# Patient Record
Sex: Male | Born: 1940 | Hispanic: No | Smoking: Former smoker
Health system: Southern US, Community
[De-identification: ages and names within clinical notes are randomized; demographics above are authoritative.]

## PROBLEM LIST (undated history)

## (undated) DIAGNOSIS — I2510 Atherosclerotic heart disease of native coronary artery without angina pectoris: Secondary | ICD-10-CM

## (undated) DIAGNOSIS — E785 Hyperlipidemia, unspecified: Secondary | ICD-10-CM

## (undated) DIAGNOSIS — I509 Heart failure, unspecified: Secondary | ICD-10-CM

## (undated) DIAGNOSIS — E119 Type 2 diabetes mellitus without complications: Secondary | ICD-10-CM

## (undated) DIAGNOSIS — I5022 Chronic systolic (congestive) heart failure: Secondary | ICD-10-CM

## (undated) HISTORY — PX: CARDIAC SURGERY: SHX584

## (undated) HISTORY — PX: HERNIA REPAIR: SHX51

---

## 2015-02-10 ENCOUNTER — Encounter: Payer: Self-pay | Admitting: Emergency Medicine

## 2015-02-10 ENCOUNTER — Emergency Department: Payer: Medicare Other

## 2015-02-10 ENCOUNTER — Inpatient Hospital Stay
Admission: EM | Admit: 2015-02-10 | Discharge: 2015-02-19 | DRG: 246 | Disposition: A | Discharge status: Skilled Nursing Facility | Payer: Medicare Other | Attending: Internal Medicine | Admitting: Internal Medicine

## 2015-02-10 DIAGNOSIS — Z87891 Personal history of nicotine dependence: Secondary | ICD-10-CM | POA: Diagnosis not present

## 2015-02-10 DIAGNOSIS — F0280 Dementia in other diseases classified elsewhere without behavioral disturbance: Secondary | ICD-10-CM | POA: Diagnosis present

## 2015-02-10 DIAGNOSIS — G309 Alzheimer's disease, unspecified: Secondary | ICD-10-CM | POA: Diagnosis present

## 2015-02-10 DIAGNOSIS — E876 Hypokalemia: Secondary | ICD-10-CM | POA: Diagnosis present

## 2015-02-10 DIAGNOSIS — R627 Adult failure to thrive: Secondary | ICD-10-CM | POA: Diagnosis present

## 2015-02-10 DIAGNOSIS — J90 Pleural effusion, not elsewhere classified: Secondary | ICD-10-CM | POA: Diagnosis present

## 2015-02-10 DIAGNOSIS — R64 Cachexia: Secondary | ICD-10-CM | POA: Diagnosis present

## 2015-02-10 DIAGNOSIS — Z9889 Other specified postprocedural states: Secondary | ICD-10-CM

## 2015-02-10 DIAGNOSIS — I25110 Atherosclerotic heart disease of native coronary artery with unstable angina pectoris: Secondary | ICD-10-CM | POA: Diagnosis present

## 2015-02-10 DIAGNOSIS — R570 Cardiogenic shock: Secondary | ICD-10-CM | POA: Diagnosis present

## 2015-02-10 DIAGNOSIS — I5021 Acute systolic (congestive) heart failure: Secondary | ICD-10-CM

## 2015-02-10 DIAGNOSIS — Z8249 Family history of ischemic heart disease and other diseases of the circulatory system: Secondary | ICD-10-CM

## 2015-02-10 DIAGNOSIS — I110 Hypertensive heart disease with heart failure: Secondary | ICD-10-CM | POA: Diagnosis present

## 2015-02-10 DIAGNOSIS — I509 Heart failure, unspecified: Secondary | ICD-10-CM

## 2015-02-10 DIAGNOSIS — E119 Type 2 diabetes mellitus without complications: Secondary | ICD-10-CM | POA: Diagnosis present

## 2015-02-10 DIAGNOSIS — I429 Cardiomyopathy, unspecified: Secondary | ICD-10-CM | POA: Diagnosis present

## 2015-02-10 DIAGNOSIS — E43 Unspecified severe protein-calorie malnutrition: Secondary | ICD-10-CM | POA: Diagnosis present

## 2015-02-10 DIAGNOSIS — F32A Depression, unspecified: Secondary | ICD-10-CM | POA: Insufficient documentation

## 2015-02-10 DIAGNOSIS — Z7982 Long term (current) use of aspirin: Secondary | ICD-10-CM

## 2015-02-10 DIAGNOSIS — Z681 Body mass index (BMI) 19 or less, adult: Secondary | ICD-10-CM | POA: Diagnosis not present

## 2015-02-10 DIAGNOSIS — R0600 Dyspnea, unspecified: Secondary | ICD-10-CM | POA: Diagnosis not present

## 2015-02-10 DIAGNOSIS — K760 Fatty (change of) liver, not elsewhere classified: Secondary | ICD-10-CM | POA: Diagnosis present

## 2015-02-10 DIAGNOSIS — I5023 Acute on chronic systolic (congestive) heart failure: Secondary | ICD-10-CM | POA: Diagnosis present

## 2015-02-10 DIAGNOSIS — R634 Abnormal weight loss: Secondary | ICD-10-CM | POA: Diagnosis not present

## 2015-02-10 DIAGNOSIS — R948 Abnormal results of function studies of other organs and systems: Secondary | ICD-10-CM | POA: Diagnosis present

## 2015-02-10 DIAGNOSIS — G8929 Other chronic pain: Secondary | ICD-10-CM | POA: Diagnosis present

## 2015-02-10 DIAGNOSIS — I25700 Atherosclerosis of coronary artery bypass graft(s), unspecified, with unstable angina pectoris: Secondary | ICD-10-CM | POA: Diagnosis not present

## 2015-02-10 DIAGNOSIS — I700 Atherosclerosis of aorta: Secondary | ICD-10-CM | POA: Diagnosis present

## 2015-02-10 DIAGNOSIS — Z79899 Other long term (current) drug therapy: Secondary | ICD-10-CM | POA: Diagnosis not present

## 2015-02-10 DIAGNOSIS — E440 Moderate protein-calorie malnutrition: Secondary | ICD-10-CM | POA: Diagnosis present

## 2015-02-10 DIAGNOSIS — Z66 Do not resuscitate: Secondary | ICD-10-CM | POA: Diagnosis present

## 2015-02-10 DIAGNOSIS — Y832 Surgical operation with anastomosis, bypass or graft as the cause of abnormal reaction of the patient, or of later complication, without mention of misadventure at the time of the procedure: Secondary | ICD-10-CM | POA: Diagnosis present

## 2015-02-10 DIAGNOSIS — E785 Hyperlipidemia, unspecified: Secondary | ICD-10-CM | POA: Diagnosis present

## 2015-02-10 DIAGNOSIS — I214 Non-ST elevation (NSTEMI) myocardial infarction: Secondary | ICD-10-CM | POA: Diagnosis present

## 2015-02-10 DIAGNOSIS — I10 Essential (primary) hypertension: Secondary | ICD-10-CM | POA: Diagnosis not present

## 2015-02-10 DIAGNOSIS — G9341 Metabolic encephalopathy: Secondary | ICD-10-CM | POA: Diagnosis present

## 2015-02-10 DIAGNOSIS — F329 Major depressive disorder, single episode, unspecified: Secondary | ICD-10-CM | POA: Diagnosis present

## 2015-02-10 DIAGNOSIS — R0602 Shortness of breath: Secondary | ICD-10-CM | POA: Diagnosis not present

## 2015-02-10 DIAGNOSIS — Z880 Allergy status to penicillin: Secondary | ICD-10-CM | POA: Diagnosis not present

## 2015-02-10 DIAGNOSIS — I200 Unstable angina: Secondary | ICD-10-CM | POA: Insufficient documentation

## 2015-02-10 DIAGNOSIS — R933 Abnormal findings on diagnostic imaging of other parts of digestive tract: Secondary | ICD-10-CM | POA: Diagnosis not present

## 2015-02-10 DIAGNOSIS — T82898A Other specified complication of vascular prosthetic devices, implants and grafts, initial encounter: Secondary | ICD-10-CM | POA: Diagnosis present

## 2015-02-10 DIAGNOSIS — G300 Alzheimer's disease with early onset: Secondary | ICD-10-CM | POA: Diagnosis not present

## 2015-02-10 HISTORY — DX: Heart failure, unspecified: I50.9

## 2015-02-10 HISTORY — DX: Type 2 diabetes mellitus without complications: E11.9

## 2015-02-10 HISTORY — DX: Hyperlipidemia, unspecified: E78.5

## 2015-02-10 HISTORY — DX: Atherosclerotic heart disease of native coronary artery without angina pectoris: I25.10

## 2015-02-10 HISTORY — DX: Chronic systolic (congestive) heart failure: I50.22

## 2015-02-10 LAB — GLUCOSE, CAPILLARY: GLUCOSE-CAPILLARY: 117 mg/dL — AB (ref 65–99)

## 2015-02-10 LAB — TYPE AND SCREEN
ABO/RH(D): O POS
Antibody Screen: NEGATIVE

## 2015-02-10 LAB — CBC WITH DIFFERENTIAL/PLATELET
Basophils Absolute: 0.1 10*3/uL (ref 0–0.1)
Basophils Relative: 1 %
EOS PCT: 1 %
Eosinophils Absolute: 0 10*3/uL (ref 0–0.7)
HEMATOCRIT: 40.9 % (ref 40.0–52.0)
Hemoglobin: 12.8 g/dL — ABNORMAL LOW (ref 13.0–18.0)
LYMPHS ABS: 1.6 10*3/uL (ref 1.0–3.6)
LYMPHS PCT: 24 %
MCH: 25.1 pg — AB (ref 26.0–34.0)
MCHC: 31.4 g/dL — AB (ref 32.0–36.0)
MCV: 79.8 fL — AB (ref 80.0–100.0)
MONO ABS: 0.3 10*3/uL (ref 0.2–1.0)
MONOS PCT: 5 %
NEUTROS ABS: 4.6 10*3/uL (ref 1.4–6.5)
Neutrophils Relative %: 69 %
PLATELETS: 372 10*3/uL (ref 150–440)
RBC: 5.12 MIL/uL (ref 4.40–5.90)
RDW: 17.9 % — AB (ref 11.5–14.5)
WBC: 6.7 10*3/uL (ref 3.8–10.6)

## 2015-02-10 LAB — HEPATIC FUNCTION PANEL
ALT: 14 U/L — ABNORMAL LOW (ref 17–63)
AST: 19 U/L (ref 15–41)
Albumin: 3.4 g/dL — ABNORMAL LOW (ref 3.5–5.0)
Alkaline Phosphatase: 69 U/L (ref 38–126)
BILIRUBIN DIRECT: 0.3 mg/dL (ref 0.1–0.5)
Indirect Bilirubin: 1 mg/dL — ABNORMAL HIGH (ref 0.3–0.9)
TOTAL PROTEIN: 7.6 g/dL (ref 6.5–8.1)
Total Bilirubin: 1.3 mg/dL — ABNORMAL HIGH (ref 0.3–1.2)

## 2015-02-10 LAB — TROPONIN I
TROPONIN I: 0.03 ng/mL (ref <0.031)
Troponin I: <0.03 ng/mL (ref <0.031)
Troponin I: 0.03 ng/mL (ref <0.031)

## 2015-02-10 LAB — BASIC METABOLIC PANEL
ANION GAP: 10 (ref 5–15)
BUN: 18 mg/dL (ref 6–20)
CALCIUM: 9 mg/dL (ref 8.9–10.3)
CO2: 24 mmol/L (ref 22–32)
CREATININE: 1.02 mg/dL (ref 0.61–1.24)
Chloride: 106 mmol/L (ref 101–111)
GFR calc Af Amer: >60 mL/min (ref >60)
GFR calc non Af Amer: >60 mL/min (ref >60)
GLUCOSE: 133 mg/dL — AB (ref 65–99)
Potassium: 3.8 mmol/L (ref 3.5–5.1)
Sodium: 140 mmol/L (ref 135–145)

## 2015-02-10 LAB — CBC
HEMATOCRIT: 38.1 % — AB (ref 40.0–52.0)
HEMOGLOBIN: 12.3 g/dL — AB (ref 13.0–18.0)
MCH: 26 pg (ref 26.0–34.0)
MCHC: 32.3 g/dL (ref 32.0–36.0)
MCV: 80.7 fL (ref 80.0–100.0)
Platelets: 365 10*3/uL (ref 150–440)
RBC: 4.73 MIL/uL (ref 4.40–5.90)
RDW: 17.9 % — ABNORMAL HIGH (ref 11.5–14.5)
WBC: 7.9 10*3/uL (ref 3.8–10.6)

## 2015-02-10 LAB — ABO/RH: ABO/RH(D): O POS

## 2015-02-10 LAB — BRAIN NATRIURETIC PEPTIDE: B Natriuretic Peptide: 1593 pg/mL — ABNORMAL HIGH (ref 0.0–100.0)

## 2015-02-10 LAB — CREATININE, SERUM
Creatinine, Ser: 1.1 mg/dL (ref 0.61–1.24)
GFR calc Af Amer: >60 mL/min (ref >60)

## 2015-02-10 MED ORDER — HYDROCODONE-ACETAMINOPHEN 5-325 MG PO TABS
1.0000 | ORAL_TABLET | ORAL | Status: DC | PRN
  Administered 2015-02-13: 2 via ORAL
  Filled 2015-02-10: qty 2

## 2015-02-10 MED ORDER — FUROSEMIDE 10 MG/ML IJ SOLN
40.0000 mg | Freq: Two times a day (BID) | INTRAMUSCULAR | Status: DC
  Administered 2015-02-10: 40 mg via INTRAVENOUS
  Filled 2015-02-10 (×2): qty 4

## 2015-02-10 MED ORDER — PRAVASTATIN SODIUM 20 MG PO TABS
80.0000 mg | ORAL_TABLET | Freq: Every day | ORAL | Status: DC
  Administered 2015-02-10 – 2015-02-15 (×6): 80 mg via ORAL
  Filled 2015-02-10 (×6): qty 4

## 2015-02-10 MED ORDER — GLIPIZIDE 5 MG PO TABS
10.0000 mg | ORAL_TABLET | Freq: Every day | ORAL | Status: DC
  Administered 2015-02-11 – 2015-02-15 (×5): 10 mg via ORAL
  Filled 2015-02-10 (×5): qty 2

## 2015-02-10 MED ORDER — PNEUMOCOCCAL VAC POLYVALENT 25 MCG/0.5ML IJ INJ
0.5000 mL | INJECTION | INTRAMUSCULAR | Status: DC
  Filled 2015-02-10: qty 0.5

## 2015-02-10 MED ORDER — IOHEXOL 350 MG/ML SOLN
100.0000 mL | Freq: Once | INTRAVENOUS | Status: AC | PRN
  Administered 2015-02-10: 100 mL via INTRAVENOUS

## 2015-02-10 MED ORDER — FUROSEMIDE 10 MG/ML IJ SOLN
40.0000 mg | Freq: Once | INTRAMUSCULAR | Status: AC
  Administered 2015-02-10: 40 mg via INTRAVENOUS
  Filled 2015-02-10: qty 4

## 2015-02-10 MED ORDER — ONDANSETRON HCL 4 MG/2ML IJ SOLN: 4.0000 mg | Freq: Four times a day (QID) | INTRAMUSCULAR | Status: DC | PRN

## 2015-02-10 MED ORDER — INFLUENZA VAC SPLIT QUAD 0.5 ML IM SUSY
0.5000 mL | PREFILLED_SYRINGE | INTRAMUSCULAR | Status: DC
Start: 2015-02-11 — End: 2015-02-16
  Filled 2015-02-10: qty 0.5

## 2015-02-10 MED ORDER — ACETAMINOPHEN 325 MG PO TABS: 650.0000 mg | ORAL_TABLET | Freq: Four times a day (QID) | ORAL | Status: DC | PRN

## 2015-02-10 MED ORDER — ENOXAPARIN SODIUM 40 MG/0.4ML ~~LOC~~ SOLN
40.0000 mg | SUBCUTANEOUS | Status: DC
  Administered 2015-02-10 – 2015-02-12 (×3): 40 mg via SUBCUTANEOUS
  Filled 2015-02-10 (×3): qty 0.4

## 2015-02-10 MED ORDER — CHOLECALCIFEROL 250 MCG (10000 UT) PO TABS: 1.0000 | ORAL_TABLET | Freq: Every day | ORAL | Status: DC

## 2015-02-10 MED ORDER — VITAMIN D 1000 UNITS PO TABS
1000.0000 [IU] | ORAL_TABLET | Freq: Every day | ORAL | Status: DC
  Administered 2015-02-10 – 2015-02-15 (×6): 1000 [IU] via ORAL
  Filled 2015-02-10 (×8): qty 1

## 2015-02-10 MED ORDER — DONEPEZIL HCL 5 MG PO TABS
10.0000 mg | ORAL_TABLET | Freq: Every day | ORAL | Status: DC
  Administered 2015-02-10 – 2015-02-13 (×4): 10 mg via ORAL
  Filled 2015-02-10 (×4): qty 2

## 2015-02-10 MED ORDER — IOHEXOL 240 MG/ML SOLN
25.0000 mL | Freq: Once | INTRAMUSCULAR | Status: AC | PRN
  Administered 2015-02-10: 25 mL via ORAL

## 2015-02-10 MED ORDER — ACETAMINOPHEN 650 MG RE SUPP: 650.0000 mg | Freq: Four times a day (QID) | RECTAL | Status: DC | PRN

## 2015-02-10 MED ORDER — ASPIRIN EC 81 MG PO TBEC
81.0000 mg | DELAYED_RELEASE_TABLET | Freq: Every day | ORAL | Status: DC
  Administered 2015-02-10 – 2015-02-15 (×5): 81 mg via ORAL
  Filled 2015-02-10 (×6): qty 1

## 2015-02-10 MED ORDER — METFORMIN HCL 500 MG PO TABS
1000.0000 mg | ORAL_TABLET | Freq: Two times a day (BID) | ORAL | Status: DC
  Administered 2015-02-12: 1000 mg via ORAL
  Filled 2015-02-10 (×2): qty 2

## 2015-02-10 MED ORDER — ONDANSETRON HCL 4 MG PO TABS: 4.0000 mg | ORAL_TABLET | Freq: Four times a day (QID) | ORAL | Status: DC | PRN

## 2015-02-10 NOTE — H&P (Signed)
[facility] - [place] [facility]   PATIENT NAME: Manuel Anthony    MR#:  [id number]  DATE OF BIRTH:  [birth year]  DATE OF ADMISSION:  02/10/2015  PRIMARY CARE PHYSICIAN: No PCP Per Patient   REQUESTING/REFERRING PHYSICIAN:   CHIEF COMPLAINT: Shortness of breath    Chief Complaint  Patient presents with  . Shortness of Breath  . Weakness    HISTORY OF PRESENT ILLNESS:  Manuel Anthony  is a 75 y.o. male with a known history of htn,DMII,came in with SOB. Patient is having shortness of breath for the past 1 week associated with orthopnea, PND. No pedal edema. Had chest discomfort earlier today. Denies any at this time. Has some cough but no phlegm. Family also reported that he is been losing weight and also has some fatigue. No night sweats. No recent travel.  PAST MEDICAL HISTORY:   Past Medical History  Diagnosis Date  . Diabetes mellitus without complication (HCC)   . CHF (congestive heart failure) (HCC)     PAST SURGICAL HISTOIRY:  H/o quadriple  Bypass  Past Surgical History  Procedure Laterality Date  . Cardiac surgery    . Hernia repair      SOCIAL HISTORY:   Social History  Substance Use Topics  . Smoking status: Former Games developer  . Smokeless tobacco: Not on file  . Alcohol Use: No    FAMILY HISTORY:  No family history on file. No family h/o htn,or DMII DRUG ALLERGIES:   Allergies  Allergen Reactions  . Penicillins Other (See Comments)    "passes out"    REVIEW OF SYSTEMS:  CONSTITUTIONAL: No fever, fatigue or weakness.  EYES: No blurred or double vision.  EARS, NOSE, AND THROAT: No tinnitus or ear pain.  RESPIRATORY: shortness of breath, fatigue, orthopnea, PND for 1 week.  CARDIOVASCULAR: , orthopnea, edema.  GASTROINTESTINAL: No nausea, vomiting, diarrhea or abdominal pain.  GENITOURINARY: No dysuria, hematuria.  ENDOCRINE: No polyuria, nocturia,  HEMATOLOGY: No anemia, easy bruising or bleeding SKIN: No rash or  lesion. MUSCULOSKELETAL: No joint pain or arthritis.   NEUROLOGIC: No tingling, numbness, weakness.  PSYCHIATRY: No anxiety or depression.   MEDICATIONS AT HOME:   Prior to Admission medications   Medication Sig Start Date End Date Taking? Authorizing Provider  aspirin EC 81 MG tablet Take 81 mg by mouth daily.   Yes Historical Provider, MD  Cholecalciferol 10000 units TABS Take 1 tablet by mouth daily.   Yes Historical Provider, MD  donepezil (ARICEPT) 10 MG tablet Take 10 mg by mouth at bedtime.   Yes Historical Provider, MD  glipiZIDE (GLUCOTROL) 10 MG tablet Take 10 mg by mouth daily before breakfast.   Yes Historical Provider, MD  metFORMIN (GLUCOPHAGE) 1000 MG tablet Take 1,000 mg by mouth 2 (two) times daily with a meal.   Yes Historical Provider, MD  pravastatin (PRAVACHOL) 80 MG tablet Take 80 mg by mouth daily.   Yes Historical Provider, MD      VITAL SIGNS:  Blood pressure 117/85, pulse 116, temperature 97.7 F (36.5 C), temperature source Oral, resp. rate 18, height  (1.676 m), weight 58.968 kg (130 lb), SpO2 100 %.  PHYSICAL EXAMINATION:  GENERAL:  75 y.o.-year-old patient lying in the bed with no acute distress.  EYES: Pupils equal, round, reactive to light and accommodation. No scleral icterus. Extraocular muscles intact.  HEENT: Head atraumatic, normocephalic. Oropharynx and nasopharynx clear.  NECK:  Supple, no jugular venous distention. No thyroid enlargement, no  tenderness.  LUNGS: decreased breath sounds at bases, faint basilar crepitations present.  CARDIOVASCULAR: S1, S2 normal. No murmurs, rubs, or gallops.  ABDOMEN: Soft, nontender, nondistended. Bowel sounds present. No organomegaly or mass.  EXTREMITIES: No pedal edema, cyanosis, or clubbing.  NEUROLOGIC: Cranial nerves II through XII are intact. Muscle strength 5/5 in all extremities. Sensation intact. Gait not checked.  PSYCHIATRIC: The patient is alert and oriented x 3.  SKIN: No obvious rash,  lesion, or ulcer.   LABORATORY PANEL:   CBC  Recent Labs Lab 02/10/15 0838  WBC 6.7  HGB 12.8*  HCT 40.9  PLT 372   ------------------------------------------------------------------------------------------------------------------  Chemistries   Recent Labs Lab 02/10/15 0838  NA 140  K 3.8  CL 106  CO2 24  GLUCOSE 133*  BUN 18  CREATININE 1.02  CALCIUM 9.0  AST 19  ALT 14*  ALKPHOS 69  BILITOT 1.3*   ------------------------------------------------------------------------------------------------------------------  Cardiac Enzymes  Recent Labs Lab 02/10/15 0838  TROPONINI <0.03   ------------------------------------------------------------------------------------------------------------------  RADIOLOGY:  Ct Angio Chest Pe W/cm &/or Wo Cm  02/10/2015  CLINICAL DATA:  Worsening shortness of breath over the past few days. Weakness. EXAM: CT ANGIOGRAPHY CHEST CT ABDOMEN AND PELVIS WITH CONTRAST TECHNIQUE: Multidetector CT imaging of the chest was performed using the standard protocol during bolus administration of intravenous contrast. Multiplanar CT image reconstructions and MIPs were obtained to evaluate the vascular anatomy. Multidetector CT imaging of the abdomen and pelvis was performed using the standard protocol during bolus administration of intravenous contrast. CONTRAST:  OMNIPAQUE IOHEXOL 350 MG/ML SOLN COMPARISON:  Chest x-ray earlier today. FINDINGS: CTA CHEST FINDINGS No filling defects in the pulmonary arteries to suggest pulmonary emboli. There is mild cardiomegaly. Coronary artery calcifications. Prior CABG. Aorta is normal caliber. No mediastinal, hilar, or axillary adenopathy. Moderate bilateral pleural effusions, right greater than left. There is a subpulmonic component on the left. Minimal compressive atelectasis in the lower lobes bilaterally. Peripheral densities are noted in the upper lobes bilaterally most compatible with scarring. Several  scattered pulmonary nodules in the upper lobes bilaterally as well. 8 mm nodule in the left upper lobe on image 25. 9 mm nodule in the upper lobe on image 20. 8 mm nodule in the right upper lobe on image 27 with smaller subpleural nodule in on image 23. Mild peribronchial thickening. Chest wall soft tissues are unremarkable. No acute bony abnormality. CT ABDOMEN and PELVIS FINDINGS Low-density area adjacent to the falciform ligament within the liver likely represents area of focal fatty infiltration. No suspicious hepatic abnormality. Spleen, pancreas, adrenals and right kidney are unremarkable. Left lower pole parapelvic cyst which appears benign. No hydronephrosis. Aortic atherosclerotic calcifications and irregularity. No aneurysm. Stomach, small bowel decompressed and unremarkable. Circumferential wall thickening within the rectum suggesting proctitis. Remainder of the colon wall is normal. There is a small left femoral hernia noted containing the anterior wall of the sigmoid colon. No obstruction. Urinary bladder is unremarkable. There is trace free fluid in the pelvis. No free air or adenopathy. No acute bony abnormality or focal bone lesion. Review of the MIP images confirms the above findings. IMPRESSION: No evidence of pulmonary embolus. Cardiomegaly.  Moderate bilateral pleural effusions. Peripheral densities in the upper lobes bilaterally most compatible with scarring. Bilateral upper lobe nodular densities warrant follow-up. Recommend follow-up CT in 3-6 months to assess stability. Area of circumferential wall thickening within the click rectum most compatible with infectious or inflammatory proctitis. Cancer felt less likely but cannot be completely excluded. Consider  colonoscopy after acute symptoms resolve. Small left femoral hernia containing the anterior wall of the sigmoid colon. No evidence of bowel obstruction. Small amount of free fluid in the pelvis. Electronically Signed   By: Charlett Nose  M.D.   On: 02/10/2015 11:35   Ct Abdomen Pelvis W Contrast  02/10/2015  CLINICAL DATA:  Worsening shortness of breath over the past few days. Weakness. EXAM: CT ANGIOGRAPHY CHEST CT ABDOMEN AND PELVIS WITH CONTRAST TECHNIQUE: Multidetector CT imaging of the chest was performed using the standard protocol during bolus administration of intravenous contrast. Multiplanar CT image reconstructions and MIPs were obtained to evaluate the vascular anatomy. Multidetector CT imaging of the abdomen and pelvis was performed using the standard protocol during bolus administration of intravenous contrast. CONTRAST:  OMNIPAQUE IOHEXOL 350 MG/ML SOLN COMPARISON:  Chest x-ray earlier today. FINDINGS: CTA CHEST FINDINGS No filling defects in the pulmonary arteries to suggest pulmonary emboli. There is mild cardiomegaly. Coronary artery calcifications. Prior CABG. Aorta is normal caliber. No mediastinal, hilar, or axillary adenopathy. Moderate bilateral pleural effusions, right greater than left. There is a subpulmonic component on the left. Minimal compressive atelectasis in the lower lobes bilaterally. Peripheral densities are noted in the upper lobes bilaterally most compatible with scarring. Several scattered pulmonary nodules in the upper lobes bilaterally as well. 8 mm nodule in the left upper lobe on image 25. 9 mm nodule in the upper lobe on image 20. 8 mm nodule in the right upper lobe on image 27 with smaller subpleural nodule in on image 23. Mild peribronchial thickening. Chest wall soft tissues are unremarkable. No acute bony abnormality. CT ABDOMEN and PELVIS FINDINGS Low-density area adjacent to the falciform ligament within the liver likely represents area of focal fatty infiltration. No suspicious hepatic abnormality. Spleen, pancreas, adrenals and right kidney are unremarkable. Left lower pole parapelvic cyst which appears benign. No hydronephrosis. Aortic atherosclerotic calcifications and irregularity. No  aneurysm. Stomach, small bowel decompressed and unremarkable. Circumferential wall thickening within the rectum suggesting proctitis. Remainder of the colon wall is normal. There is a small left femoral hernia noted containing the anterior wall of the sigmoid colon. No obstruction. Urinary bladder is unremarkable. There is trace free fluid in the pelvis. No free air or adenopathy. No acute bony abnormality or focal bone lesion. Review of the MIP images confirms the above findings. IMPRESSION: No evidence of pulmonary embolus. Cardiomegaly.  Moderate bilateral pleural effusions. Peripheral densities in the upper lobes bilaterally most compatible with scarring. Bilateral upper lobe nodular densities warrant follow-up. Recommend follow-up CT in 3-6 months to assess stability. Area of circumferential wall thickening within the click rectum most compatible with infectious or inflammatory proctitis. Cancer felt less likely but cannot be completely excluded. Consider colonoscopy after acute symptoms resolve. Small left femoral hernia containing the anterior wall of the sigmoid colon. No evidence of bowel obstruction. Small amount of free fluid in the pelvis. Electronically Signed   By: Charlett Nose M.D.   On: 02/10/2015 11:35   Dg Chest Port 1 View  02/10/2015  CLINICAL DATA:  Shortness of Breath EXAM: PORTABLE CHEST 1 VIEW COMPARISON:  None. FINDINGS: There is mild bibasilar interstitial and patchy alveolar edema. Lungs elsewhere clear. Heart is enlarged with pulmonary venous hypertension. No adenopathy. Patient is status post coronary artery bypass grafting. There is degenerative change in each shoulder. IMPRESSION: Evidence of a degree of congestive heart failure. Electronically Signed   By: Bretta Bang III M.D.   On: 02/10/2015 08:29  EKG:   Orders placed or performed during the hospital encounter of 02/10/15  . ED EKG  . ED EKG  . EKG 12-Lead  . EKG 12-Lead   sinus tachycardia at 1 13 bpm, T-wave  inversions in leads V4, V5, V6.   Impression and plan #1 acute CHF initial onset unknown systolic or diastolic: Check echocardiogram, start IV Lasix, monitor on telemetry, check daily weights, fluid restriction up to 1-1/2 L a day. #2 fatigue, shortness of breath and weight loss concerning for malignancy, CT chest negative for PE but possible nodules in the lungs. Repeat chest CAT scan in 3 months CT abdomen showed proctitis patient has no abdominal pain or diarrhea. So we will consult GI for possible colonoscopy because of the weight loss, fatigue. #3 hypertension: Controlled #4 diabetes mellitus type 2: Continue home medications. History of coronary artery disease now with congestive heart failure: Check cardiac markers, echocardiogram.  Patient is from [facility] but they don't have beds on tele,so he is admitted here,    All the records are reviewed and case discussed with ED provider. Management plans discussed with the patient, family and they are in agreement.  CODE STATUS: full  TOTAL TIME TAKING CARE OF THIS PATIENT: .    Katha Hamming M.D on 02/10/2015 at 1:22 PM  Between 7am to 6pm - Pager - [phone]  After 6pm go to [email or web address] - password [email or web address] [facility]  [place] [facility]  Office  [phone]  CC: Primary care physician; No PCP Per Patient  Note: This dictation was prepared with Dragon dictation along with smaller phrase technology. Any transcriptional errors that result from this process are unintentional.

## 2015-02-10 NOTE — ED Provider Notes (Signed)
[facility] Emergency Department Provider Note     Time seen: ----------------------------------------- 8:02 AM on 02/10/2015 -----------------------------------------    I have reviewed the triage vital signs and the nursing notes.   HISTORY  Chief Complaint No chief complaint on file.    HPI Manuel Anthony is a 75 y.o. male who presents ER for worsening shortness of breath over the last several days. Patient is also had significant weakness, has appeared increasingly lethargic according to her family members that have recently moved here. Patient states his mouth is very dry, son states she has early dementia. Patient has history of diabetes and history of heart surgery, denies fevers but is positive for chest pain, denies vomiting or diarrhea.   No past medical history on file.  There are no active problems to display for this patient.   No past surgical history on file.  Allergies Review of patient's allergies indicates not on file.  Social History Social History  Substance Use Topics  . Smoking status: Not on file  . Smokeless tobacco: Not on file  . Alcohol Use: Not on file    Review of Systems Constitutional: Negative for fever. Eyes: Negative for visual changes. ENT: Negative for sore throat. Cardiovascular: Negative for chest pain. Respiratory: Positive for shortness of breath Gastrointestinal: Negative for abdominal pain, vomiting and diarrhea. Genitourinary: Negative for dysuria. Musculoskeletal: Negative for back pain. Skin: Negative for rash. Neurological: Negative for headaches, positive for weakness  10-point ROS otherwise negative.  ____________________________________________   PHYSICAL EXAM:  VITAL SIGNS: ED Triage Vitals  Enc Vitals Group     BP --      Pulse --      Resp --      Temp --      Temp src --      SpO2 --      Weight --      Height --      Head Cir --      Peak Flow --      Pain Score --       Pain Loc --      Pain Edu? --      Excl. in GC? --     Constitutional: Alert and oriented. Well appearing and in no distress. Eyes: Conjunctivae are pale. PERRL. Normal extraocular movements. ENT   Head: Normocephalic and atraumatic.   Nose: No congestion/rhinnorhea.   Mouth/Throat: Mucous membranes are moist.   Neck: No stridor. Cardiovascular: Normal rate, regular rhythm. Normal and symmetric distal pulses are present in all extremities. No murmurs, rubs, or gallops. Respiratory: Normal respiratory effort without tachypnea nor retractions. Rales noted in the left base only Gastrointestinal: Soft and nontender. No distention. No abdominal bruits.  Musculoskeletal: Nontender with normal range of motion in all extremities. No joint effusions.  No lower extremity tenderness nor edema. Neurologic:  Normal speech and language. No gross focal neurologic deficits are appreciated. Speech is normal. Gait limited by dyspnea Skin:  Skin is warm, dry and intact. Pallor is noted Psychiatric: Mood and affect are normal. Speech and behavior are normal. Patient exhibits appropriate insight and judgment. ____________________________________________  EKG: Interpreted by me. Sinus tachycardia with a rate of 113 bpm, PVCs, normal axis. No evidence of acute infarction, nonspecific ST and T-wave segments  ____________________________________________  ED COURSE:  Pertinent labs & imaging results that were available during my care of the patient were reviewed by me and considered in my medical decision making (see chart for details).  ____________________________________________  LABS (pertinent positives/negatives)  Labs Reviewed  CBC WITH DIFFERENTIAL/PLATELET - Abnormal; Notable for the following:    Hemoglobin 12.8 (*)    MCV 79.8 (*)    MCH 25.1 (*)    MCHC 31.4 (*)    RDW 17.9 (*)    All other components within normal limits  BASIC METABOLIC PANEL - Abnormal; Notable for  the following:    Glucose, Bld 133 (*)    All other components within normal limits  BRAIN NATRIURETIC PEPTIDE - Abnormal; Notable for the following:    B Natriuretic Peptide 1593.0 (*)    All other components within normal limits  GLUCOSE, CAPILLARY - Abnormal; Notable for the following:    Glucose-Capillary 117 (*)    All other components within normal limits  HEPATIC FUNCTION PANEL - Abnormal; Notable for the following:    Albumin 3.4 (*)    ALT 14 (*)    Total Bilirubin 1.3 (*)    Indirect Bilirubin 1.0 (*)    All other components within normal limits  TROPONIN I  TYPE AND SCREEN  ABO/RH    RADIOLOGY Images were viewed by me  IMPRESSION: Evidence of a degree of congestive heart failure. CT of the chest, abdomen and pelvis IMPRESSION: No evidence of pulmonary embolus.  Cardiomegaly. Moderate bilateral pleural effusions.  Peripheral densities in the upper lobes bilaterally most compatible with scarring. Bilateral upper lobe nodular densities warrant follow-up. Recommend follow-up CT in 3-6 months to assess stability.  Area of circumferential wall thickening within the click rectum most compatible with infectious or inflammatory proctitis. Cancer felt less likely but cannot be completely excluded. Consider colonoscopy after acute symptoms resolve.  Small left femoral hernia containing the anterior wall of the sigmoid colon. No evidence of bowel obstruction.  Small amount of free fluid in the pelvis. ____________________________________________  FINAL ASSESSMENT AND PLAN  Congestive heart failure  Plan: Patient with labs and imaging as dictated above. Patient with new onset CHF, will be started on IV Lasix for diuresis. We'll recommend observation and telemetry to ensure improvement of his symptoms. I have attempted transfer to the [facility] but they're on diversion for telemetry beds. This is according to a Dr. Wolfgang Phoenix. Patient will need to be admitted  here for further evaluation of his new onset CHF.   Emily Filbert, MD   Emily Filbert, MD 02/10/15 [phone]

## 2015-02-10 NOTE — ED Notes (Signed)
Pt a/o, vs wnl except tachycardia hr 117. 2LNC applied due to HR. RA sats 94%. Lungs sound clear. Skin pale with orange tint.

## 2015-02-10 NOTE — ED Notes (Signed)
Admitting MD at bedside.

## 2015-02-10 NOTE — Progress Notes (Signed)
Manuel Anthony is a 75 y.o. male patient admitted from ED awake, alert - oriented  X 4 - no acute distress noted.  VSS - Blood pressure 104/74, pulse 110, temperature 98 F (36.7 C), temperature source Oral, resp. rate 16, height  (1.676 m), weight 58.968 kg (130 lb), SpO2 99 %.    IV in place, occlusive dsg intact without redness.  Orientation to room, and floor completed with information packet given to patient/family. Admission INP armband ID verified with patient/family, and in place.   SR up x 2, fall assessment complete, with patient and family able to verbalize understanding of risk associated with falls, and verbalized understanding to call nsg before up out of bed.  Call light within reach, patient able to voice, and demonstrate understanding.  Skin, clean-dry- intact without evidence of bruising, or skin tears.   No evidence of skin break down noted on exam. Sacral dressing applied. Skin assessed with Leah L. Tele box verified with RN and Candace, NT.    Will cont to eval and treat per MD orders.  Rudean Haskell, RN 02/10/2015 7:05 PM

## 2015-02-10 NOTE — Care Management Note (Signed)
Case Management Note  Patient Details  Name: Manuel Anthony MRN: [id number] Date of Birth: 10/13/1940  Subjective/Objective:   Reply from the [place] is that they have no telemetry beds available. Patient packet faxed by Misty Stanley, unit clerk. So DON has all of the patient information. He is to be admitted to [facility] with Dx-CHF.                 Action/Plan:   Expected Discharge Date:                  Expected Discharge Plan:     In-House Referral:     Discharge planning Services     Post Acute Care Choice:    Choice offered to:     DME Arranged:    DME Agency:     HH Arranged:    HH Agency:     Status of Service:     Medicare Important Message Given:    Date Medicare IM Given:    Medicare IM give by:    Date Additional Medicare IM Given:    Additional Medicare Important Message give by:     If discussed at Long Length of Stay Meetings, dates discussed:    Additional Comments:  Berna Bue, RN 02/10/2015, 12:07 PM

## 2015-02-10 NOTE — ED Notes (Signed)
Pts son states he has been having worsening sob over the past few days, weakness as well. Pt appears sob, states his mouth is very dry. Son states early dementia.

## 2015-02-11 ENCOUNTER — Inpatient Hospital Stay (HOSPITAL_COMMUNITY)
Admit: 2015-02-11 | Discharge: 2015-02-11 | Disposition: A | Discharge status: Home / Self Care | Payer: Medicare Other | Attending: Internal Medicine | Admitting: Internal Medicine

## 2015-02-11 ENCOUNTER — Inpatient Hospital Stay: Admit: 2015-02-11 | Payer: Medicare Other

## 2015-02-11 DIAGNOSIS — E43 Unspecified severe protein-calorie malnutrition: Secondary | ICD-10-CM | POA: Insufficient documentation

## 2015-02-11 DIAGNOSIS — R634 Abnormal weight loss: Secondary | ICD-10-CM | POA: Insufficient documentation

## 2015-02-11 DIAGNOSIS — R933 Abnormal findings on diagnostic imaging of other parts of digestive tract: Secondary | ICD-10-CM | POA: Insufficient documentation

## 2015-02-11 DIAGNOSIS — R0600 Dyspnea, unspecified: Secondary | ICD-10-CM

## 2015-02-11 LAB — CBC
HCT: 40.1 % (ref 40.0–52.0)
Hemoglobin: 12.7 g/dL — ABNORMAL LOW (ref 13.0–18.0)
MCH: 25.2 pg — AB (ref 26.0–34.0)
MCHC: 31.8 g/dL — ABNORMAL LOW (ref 32.0–36.0)
MCV: 79.3 fL — AB (ref 80.0–100.0)
PLATELETS: 375 10*3/uL (ref 150–440)
RBC: 5.06 MIL/uL (ref 4.40–5.90)
RDW: 17.5 % — AB (ref 11.5–14.5)
WBC: 7.9 10*3/uL (ref 3.8–10.6)

## 2015-02-11 LAB — BASIC METABOLIC PANEL
Anion gap: 7 (ref 5–15)
BUN: 16 mg/dL (ref 6–20)
CHLORIDE: 100 mmol/L — AB (ref 101–111)
CO2: 31 mmol/L (ref 22–32)
CREATININE: 1.06 mg/dL (ref 0.61–1.24)
Calcium: 8.9 mg/dL (ref 8.9–10.3)
GFR calc Af Amer: >60 mL/min (ref >60)
GFR calc non Af Amer: >60 mL/min (ref >60)
Glucose, Bld: 189 mg/dL — ABNORMAL HIGH (ref 65–99)
Potassium: 3.4 mmol/L — ABNORMAL LOW (ref 3.5–5.1)
SODIUM: 138 mmol/L (ref 135–145)

## 2015-02-11 LAB — GLUCOSE, CAPILLARY
GLUCOSE-CAPILLARY: 139 mg/dL — AB (ref 65–99)
Glucose-Capillary: 130 mg/dL — ABNORMAL HIGH (ref 65–99)
Glucose-Capillary: 157 mg/dL — ABNORMAL HIGH (ref 65–99)

## 2015-02-11 LAB — TROPONIN I: Troponin I: <0.03 ng/mL (ref <0.031)

## 2015-02-11 MED ORDER — POTASSIUM CHLORIDE CRYS ER 20 MEQ PO TBCR
40.0000 meq | EXTENDED_RELEASE_TABLET | Freq: Once | ORAL | Status: AC
  Administered 2015-02-11: 40 meq via ORAL
  Filled 2015-02-11: qty 2

## 2015-02-11 MED ORDER — ENSURE ENLIVE PO LIQD
237.0000 mL | Freq: Three times a day (TID) | ORAL | Status: DC
  Administered 2015-02-11 – 2015-02-15 (×11): 237 mL via ORAL

## 2015-02-11 MED ORDER — INSULIN ASPART 100 UNIT/ML ~~LOC~~ SOLN
0.0000 [IU] | Freq: Three times a day (TID) | SUBCUTANEOUS | Status: DC
  Administered 2015-02-11 – 2015-02-12 (×2): 1 [IU] via SUBCUTANEOUS
  Administered 2015-02-13: 3 [IU] via SUBCUTANEOUS
  Administered 2015-02-14 (×2): 2 [IU] via SUBCUTANEOUS
  Administered 2015-02-15: 3 [IU] via SUBCUTANEOUS
  Filled 2015-02-11: qty 3
  Filled 2015-02-11: qty 1
  Filled 2015-02-11: qty 3
  Filled 2015-02-11: qty 1
  Filled 2015-02-11 (×2): qty 2

## 2015-02-11 MED ORDER — FUROSEMIDE 10 MG/ML IJ SOLN
40.0000 mg | Freq: Two times a day (BID) | INTRAMUSCULAR | Status: DC
  Administered 2015-02-11 – 2015-02-15 (×4): 40 mg via INTRAVENOUS
  Filled 2015-02-11 (×8): qty 4

## 2015-02-11 NOTE — Progress Notes (Signed)
[facility] - [place] at [facility]   PATIENT NAME: Manuel Anthony    MR#:  [id number]  DATE OF BIRTH:  Feb 02, 1940  SUBJECTIVE:  CHIEF COMPLAINT:   Chief Complaint  Patient presents with  . Shortness of Breath  . Weakness     Had worsening orthopnea, lose of weight.  REVIEW OF SYSTEMS:  CONSTITUTIONAL: No fever, positive for fatigue or weakness.  EYES: No blurred or double vision.  EARS, NOSE, AND THROAT: No tinnitus or ear pain.  RESPIRATORY: No cough, positive for shortness of breath, no wheezing or hemoptysis.  CARDIOVASCULAR: No chest pain, orthopnea, edema.  GASTROINTESTINAL: No nausea, vomiting, diarrhea or abdominal pain.  GENITOURINARY: No dysuria, hematuria.  ENDOCRINE: No polyuria, nocturia,  HEMATOLOGY: No anemia, easy bruising or bleeding SKIN: No rash or lesion. MUSCULOSKELETAL: No joint pain or arthritis.   NEUROLOGIC: No tingling, numbness, weakness.  PSYCHIATRY: No anxiety or depression.   ROS  DRUG ALLERGIES:   Allergies  Allergen Reactions  . Penicillins Other (See Comments)    "passes out"    VITALS:  Blood pressure 95/64, pulse 114, temperature 97.6 F (36.4 C), temperature source Oral, resp. rate 18, height  (1.676 m), weight 51.846 kg (114 lb 4.8 oz), SpO2 100 %.  PHYSICAL EXAMINATION:  GENERAL:  75 y.o.-year-old thin patient lying in the bed with no acute distress.  EYES: Pupils equal, round, reactive to light and accommodation. No scleral icterus. Extraocular muscles intact.  HEENT: Head atraumatic, normocephalic. Oropharynx and nasopharynx clear.  NECK:  Supple, no jugular venous distention. No thyroid enlargement, no tenderness.  LUNGS: Normal breath sounds bilaterally, no wheezing, LL crepitation. No use of accessory muscles of respiration. He can not finish full sentense in one breath. CARDIOVASCULAR: S1, S2 normal. No murmurs, rubs, or gallops.  ABDOMEN: Soft, nontender, nondistended. Bowel sounds present.  No organomegaly or mass. Left inguinal hernea present. Reducible, non tender. EXTREMITIES: No pedal edema, cyanosis, or clubbing.  NEUROLOGIC: Cranial nerves II through XII are intact. Muscle strength 5/5 in all extremities. Sensation intact. Gait not checked.  PSYCHIATRIC: The patient is alert and oriented x 3.  SKIN: No obvious rash, lesion, or ulcer.   Physical Exam LABORATORY PANEL:   CBC  Recent Labs Lab 02/11/15 0512  WBC 7.9  HGB 12.7*  HCT 40.1  PLT 375   ------------------------------------------------------------------------------------------------------------------  Chemistries   Recent Labs Lab 02/10/15 0838  02/11/15 0512  NA 140  --  138  K 3.8  --  3.4*  CL 106  --  100*  CO2 24  --  31  GLUCOSE 133*  --  189*  BUN 18  --  16  CREATININE 1.02  < > 1.06  CALCIUM 9.0  --  8.9  AST 19  --   --   ALT 14*  --   --   ALKPHOS 69  --   --   BILITOT 1.3*  --   --   < > = values in this interval not displayed. ------------------------------------------------------------------------------------------------------------------  Cardiac Enzymes  Recent Labs Lab 02/10/15 1939 02/11/15 0137  TROPONINI 0.03 <0.03   ------------------------------------------------------------------------------------------------------------------  RADIOLOGY:  Ct Angio Chest Pe W/cm &/or Wo Cm  02/10/2015  CLINICAL DATA:  Worsening shortness of breath over the past few days. Weakness. EXAM: CT ANGIOGRAPHY CHEST CT ABDOMEN AND PELVIS WITH CONTRAST TECHNIQUE: Multidetector CT imaging of the chest was performed using the standard protocol during bolus administration of intravenous contrast. Multiplanar CT image reconstructions and MIPs were  obtained to evaluate the vascular anatomy. Multidetector CT imaging of the abdomen and pelvis was performed using the standard protocol during bolus administration of intravenous contrast. CONTRAST:  OMNIPAQUE IOHEXOL 350 MG/ML SOLN COMPARISON:   Chest x-ray earlier today. FINDINGS: CTA CHEST FINDINGS No filling defects in the pulmonary arteries to suggest pulmonary emboli. There is mild cardiomegaly. Coronary artery calcifications. Prior CABG. Aorta is normal caliber. No mediastinal, hilar, or axillary adenopathy. Moderate bilateral pleural effusions, right greater than left. There is a subpulmonic component on the left. Minimal compressive atelectasis in the lower lobes bilaterally. Peripheral densities are noted in the upper lobes bilaterally most compatible with scarring. Several scattered pulmonary nodules in the upper lobes bilaterally as well. 8 mm nodule in the left upper lobe on image 25. 9 mm nodule in the upper lobe on image 20. 8 mm nodule in the right upper lobe on image 27 with smaller subpleural nodule in on image 23. Mild peribronchial thickening. Chest wall soft tissues are unremarkable. No acute bony abnormality. CT ABDOMEN and PELVIS FINDINGS Low-density area adjacent to the falciform ligament within the liver likely represents area of focal fatty infiltration. No suspicious hepatic abnormality. Spleen, pancreas, adrenals and right kidney are unremarkable. Left lower pole parapelvic cyst which appears benign. No hydronephrosis. Aortic atherosclerotic calcifications and irregularity. No aneurysm. Stomach, small bowel decompressed and unremarkable. Circumferential wall thickening within the rectum suggesting proctitis. Remainder of the colon wall is normal. There is a small left femoral hernia noted containing the anterior wall of the sigmoid colon. No obstruction. Urinary bladder is unremarkable. There is trace free fluid in the pelvis. No free air or adenopathy. No acute bony abnormality or focal bone lesion. Review of the MIP images confirms the above findings. IMPRESSION: No evidence of pulmonary embolus. Cardiomegaly.  Moderate bilateral pleural effusions. Peripheral densities in the upper lobes bilaterally most compatible with  scarring. Bilateral upper lobe nodular densities warrant follow-up. Recommend follow-up CT in 3-6 months to assess stability. Area of circumferential wall thickening within the click rectum most compatible with infectious or inflammatory proctitis. Cancer felt less likely but cannot be completely excluded. Consider colonoscopy after acute symptoms resolve. Small left femoral hernia containing the anterior wall of the sigmoid colon. No evidence of bowel obstruction. Small amount of free fluid in the pelvis. Electronically Signed   By: Charlett Nose M.D.   On: 02/10/2015 11:35   Ct Abdomen Pelvis W Contrast  02/10/2015  CLINICAL DATA:  Worsening shortness of breath over the past few days. Weakness. EXAM: CT ANGIOGRAPHY CHEST CT ABDOMEN AND PELVIS WITH CONTRAST TECHNIQUE: Multidetector CT imaging of the chest was performed using the standard protocol during bolus administration of intravenous contrast. Multiplanar CT image reconstructions and MIPs were obtained to evaluate the vascular anatomy. Multidetector CT imaging of the abdomen and pelvis was performed using the standard protocol during bolus administration of intravenous contrast. CONTRAST:  OMNIPAQUE IOHEXOL 350 MG/ML SOLN COMPARISON:  Chest x-ray earlier today. FINDINGS: CTA CHEST FINDINGS No filling defects in the pulmonary arteries to suggest pulmonary emboli. There is mild cardiomegaly. Coronary artery calcifications. Prior CABG. Aorta is normal caliber. No mediastinal, hilar, or axillary adenopathy. Moderate bilateral pleural effusions, right greater than left. There is a subpulmonic component on the left. Minimal compressive atelectasis in the lower lobes bilaterally. Peripheral densities are noted in the upper lobes bilaterally most compatible with scarring. Several scattered pulmonary nodules in the upper lobes bilaterally as well. 8 mm nodule in the left upper lobe on  image 25. 9 mm nodule in the upper lobe on image 20. 8 mm nodule in the right  upper lobe on image 27 with smaller subpleural nodule in on image 23. Mild peribronchial thickening. Chest wall soft tissues are unremarkable. No acute bony abnormality. CT ABDOMEN and PELVIS FINDINGS Low-density area adjacent to the falciform ligament within the liver likely represents area of focal fatty infiltration. No suspicious hepatic abnormality. Spleen, pancreas, adrenals and right kidney are unremarkable. Left lower pole parapelvic cyst which appears benign. No hydronephrosis. Aortic atherosclerotic calcifications and irregularity. No aneurysm. Stomach, small bowel decompressed and unremarkable. Circumferential wall thickening within the rectum suggesting proctitis. Remainder of the colon wall is normal. There is a small left femoral hernia noted containing the anterior wall of the sigmoid colon. No obstruction. Urinary bladder is unremarkable. There is trace free fluid in the pelvis. No free air or adenopathy. No acute bony abnormality or focal bone lesion. Review of the MIP images confirms the above findings. IMPRESSION: No evidence of pulmonary embolus. Cardiomegaly.  Moderate bilateral pleural effusions. Peripheral densities in the upper lobes bilaterally most compatible with scarring. Bilateral upper lobe nodular densities warrant follow-up. Recommend follow-up CT in 3-6 months to assess stability. Area of circumferential wall thickening within the click rectum most compatible with infectious or inflammatory proctitis. Cancer felt less likely but cannot be completely excluded. Consider colonoscopy after acute symptoms resolve. Small left femoral hernia containing the anterior wall of the sigmoid colon. No evidence of bowel obstruction. Small amount of free fluid in the pelvis. Electronically Signed   By: Charlett Nose M.D.   On: 02/10/2015 11:35   Dg Chest Port 1 View  02/10/2015  CLINICAL DATA:  Shortness of Breath EXAM: PORTABLE CHEST 1 VIEW COMPARISON:  None. FINDINGS: There is mild bibasilar  interstitial and patchy alveolar edema. Lungs elsewhere clear. Heart is enlarged with pulmonary venous hypertension. No adenopathy. Patient is status post coronary artery bypass grafting. There is degenerative change in each shoulder. IMPRESSION: Evidence of a degree of congestive heart failure. Electronically Signed   By: Bretta Bang III M.D.   On: 02/10/2015 08:29    ASSESSMENT AND PLAN:   Active Problems:   CHF (congestive heart failure) (HCC)   Loss of weight   Abnormal CT scan, gastrointestinal tract   Protein-calorie malnutrition, severe  #1 acute CHF initial onset unknown systolic or diastolic: Check echocardiogram,on IV Lasix, monitor on telemetry, check daily weights, fluid restriction up to 1-1/2 L a day.   Called Cardiology consult. #2 fatigue, shortness of breath and weight loss concerning for malignancy, CT chest negative for PE but possible  nodules in the lungs. Repeat chest CAT scan in 3 months  CT abdomen showed proctitis patient has no abdominal pain or diarrhea.    Appreciated GI consult, He may do procedure as out pt.as pt is having SOB now.   #3 hypertension: Controlled #4 diabetes mellitus type 2: Continue home medications. History of coronary artery disease now with congestive heart failure: Check cardiac markers, echocardiogram.   All the records are reviewed and case discussed with Care Management/Social Workerr. Management plans discussed with the patient, family and they are in agreement.  CODE STATUS: FUll  TOTAL TIME TAKING CARE OF THIS PATIENT: 35 minutes.  Son was in room- discussed the plan with him.   POSSIBLE D/C IN 2-3 DAYS, DEPENDING ON CLINICAL CONDITION.   Altamese Dilling M.D on 02/11/2015   Between 7am to 6pm - Pager - [phone]  After 6pm go  to [email or web address] - password [email or web address] [facility]  [place] [facility]  Office  [phone]  CC: Primary care physician; No PCP Per Patient  Note: This dictation was prepared with  Dragon dictation along with smaller phrase technology. Any transcriptional errors that result from this process are unintentional.

## 2015-02-11 NOTE — Consult Note (Signed)
[facility]  [address] [place], [place] [address] Phone: [phone] Fax : [phone]  Consultation  Referring Provider:     No ref. provider found Primary Care Physician:  No PCP Per Patient Primary Gastroenterologist:          Reason for Consultation:     Weight loss and failure to thrive with a abnormal CT scan  Date of Admission:  02/10/2015 Date of Consultation:  02/11/2015         HPI:   Manuel Anthony is a 75 y.o. male who was admitted with respiratory distress and weight loss. The patient states he has been losing weight because he does not eat is the loss of his wife in June of last year. The patient denies any black stools or bloody stools. He also denies any change in bowel habits. The patient did have a CT scan that showed some inflammation in his rectal and was suggested that he may need a colonoscopy when his respiratory status has improved. I'm now being asked to see this patient for the above reasons.  Past Medical History  Diagnosis Date  . Diabetes mellitus without complication (HCC)   . CHF (congestive heart failure) [facility])     Past Surgical History  Procedure Laterality Date  . Cardiac surgery    . Hernia repair      Prior to Admission medications   Medication Sig Start Date End Date Taking? Authorizing Provider  aspirin EC 81 MG tablet Take 81 mg by mouth daily.   Yes Historical Provider, MD  Cholecalciferol 1000 units tablet Take 1,000 Units by mouth daily.   Yes Historical Provider, MD  donepezil (ARICEPT) 10 MG tablet Take 10 mg by mouth at bedtime.   Yes Historical Provider, MD  glipiZIDE (GLUCOTROL) 10 MG tablet Take 10 mg by mouth daily before breakfast.   Yes Historical Provider, MD  metFORMIN (GLUCOPHAGE) 1000 MG tablet Take 1,000 mg by mouth 2 (two) times daily with a meal.   Yes Historical Provider, MD  pravastatin (PRAVACHOL) 80 MG tablet Take 80 mg by mouth daily.   Yes Historical Provider, MD    History reviewed. No  pertinent family history.   Social History  Substance Use Topics  . Smoking status: Former Games developer  . Smokeless tobacco: None  . Alcohol Use: No    Allergies as of 02/10/2015 - Review Complete 02/10/2015  Allergen Reaction Noted  . Penicillins Other (See Comments) 02/10/2015    Review of Systems:    All systems reviewed and negative except where noted in HPI.   Physical Exam:  Vital signs in last 24 hours: Temp:  [97.5 F (36.4 C)-97.6 F (36.4 C)] 97.6 F (36.4 C) (01/24 1218) Pulse Rate:  [100-116] 114 (01/24 1218) Resp:  [18] 18 (01/24 0601) BP: (95-106)/(64-73) 95/64 mmHg (01/24 1218) SpO2:  [99 %-100 %] 100 % (01/24 1218) Weight:  [114 lb 4.8 oz (51.846 kg)] 114 lb 4.8 oz (51.846 kg) (01/24 0601) Last BM Date: 02/10/15 General:   Pleasant, cooperative in NAD Head:  Normocephalic and atraumatic. Eyes:   No icterus.   Conjunctiva pink. PERRLA. Ears:  Normal auditory acuity. Neck:  Supple; no masses or thyroidomegaly Lungs: Respirations even and unlabored. Lungs clear to auscultation bilaterally.   No wheezes, crackles, or rhonchi.  Heart:  Regular rate and rhythm;  Without murmur, clicks, rubs or gallops Abdomen:  Soft, nondistended, nontender. Normal bowel sounds. No appreciable masses or hepatomegaly.  No rebound or guarding.  Rectal:  Not performed. Msk:  Symmetrical without gross deformities.    Extremities:  Without edema, cyanosis or clubbing. Neurologic:  Alert and oriented x3;  grossly normal neurologically. Skin:  Intact without significant lesions or rashes. Cervical Nodes:  No significant cervical adenopathy. Psych:  Alert and cooperative. Normal affect.  LAB RESULTS:  Recent Labs  02/10/15 0838 02/10/15 1730 02/11/15 0512  WBC 6.7 7.9 7.9  HGB 12.8* 12.3* 12.7*  HCT 40.9 38.1* 40.1  PLT 372 365 375   BMET  Recent Labs  02/10/15 0838 02/10/15 1730 02/11/15 0512  NA 140  --  138  K 3.8  --  3.4*  CL 106  --  100*  CO2 24  --  31  GLUCOSE  133*  --  189*  BUN 18  --  16  CREATININE 1.02 1.10 1.06  CALCIUM 9.0  --  8.9   LFT  Recent Labs  02/10/15 0838  PROT 7.6  ALBUMIN 3.4*  AST 19  ALT 14*  ALKPHOS 69  BILITOT 1.3*  BILIDIR 0.3  IBILI 1.0*   PT/INR No results for input(s): LABPROT, INR in the last 72 hours.  STUDIES: Ct Angio Chest Pe W/cm &/or Wo Cm  02/10/2015  CLINICAL DATA:  Worsening shortness of breath over the past few days. Weakness. EXAM: CT ANGIOGRAPHY CHEST CT ABDOMEN AND PELVIS WITH CONTRAST TECHNIQUE: Multidetector CT imaging of the chest was performed using the standard protocol during bolus administration of intravenous contrast. Multiplanar CT image reconstructions and MIPs were obtained to evaluate the vascular anatomy. Multidetector CT imaging of the abdomen and pelvis was performed using the standard protocol during bolus administration of intravenous contrast. CONTRAST:  OMNIPAQUE IOHEXOL 350 MG/ML SOLN COMPARISON:  Chest x-ray earlier today. FINDINGS: CTA CHEST FINDINGS No filling defects in the pulmonary arteries to suggest pulmonary emboli. There is mild cardiomegaly. Coronary artery calcifications. Prior CABG. Aorta is normal caliber. No mediastinal, hilar, or axillary adenopathy. Moderate bilateral pleural effusions, right greater than left. There is a subpulmonic component on the left. Minimal compressive atelectasis in the lower lobes bilaterally. Peripheral densities are noted in the upper lobes bilaterally most compatible with scarring. Several scattered pulmonary nodules in the upper lobes bilaterally as well. 8 mm nodule in the left upper lobe on image 25. 9 mm nodule in the upper lobe on image 20. 8 mm nodule in the right upper lobe on image 27 with smaller subpleural nodule in on image 23. Mild peribronchial thickening. Chest wall soft tissues are unremarkable. No acute bony abnormality. CT ABDOMEN and PELVIS FINDINGS Low-density area adjacent to the falciform ligament within the liver  likely represents area of focal fatty infiltration. No suspicious hepatic abnormality. Spleen, pancreas, adrenals and right kidney are unremarkable. Left lower pole parapelvic cyst which appears benign. No hydronephrosis. Aortic atherosclerotic calcifications and irregularity. No aneurysm. Stomach, small bowel decompressed and unremarkable. Circumferential wall thickening within the rectum suggesting proctitis. Remainder of the colon wall is normal. There is a small left femoral hernia noted containing the anterior wall of the sigmoid colon. No obstruction. Urinary bladder is unremarkable. There is trace free fluid in the pelvis. No free air or adenopathy. No acute bony abnormality or focal bone lesion. Review of the MIP images confirms the above findings. IMPRESSION: No evidence of pulmonary embolus. Cardiomegaly.  Moderate bilateral pleural effusions. Peripheral densities in the upper lobes bilaterally most compatible with scarring. Bilateral upper lobe nodular densities warrant follow-up. Recommend follow-up CT in 3-6 months to assess stability. Area  of circumferential wall thickening within the click rectum most compatible with infectious or inflammatory proctitis. Cancer felt less likely but cannot be completely excluded. Consider colonoscopy after acute symptoms resolve. Small left femoral hernia containing the anterior wall of the sigmoid colon. No evidence of bowel obstruction. Small amount of free fluid in the pelvis. Electronically Signed   By: Charlett Nose M.D.   On: 02/10/2015 11:35   Ct Abdomen Pelvis W Contrast  02/10/2015  CLINICAL DATA:  Worsening shortness of breath over the past few days. Weakness. EXAM: CT ANGIOGRAPHY CHEST CT ABDOMEN AND PELVIS WITH CONTRAST TECHNIQUE: Multidetector CT imaging of the chest was performed using the standard protocol during bolus administration of intravenous contrast. Multiplanar CT image reconstructions and MIPs were obtained to evaluate the vascular anatomy.  Multidetector CT imaging of the abdomen and pelvis was performed using the standard protocol during bolus administration of intravenous contrast. CONTRAST:  OMNIPAQUE IOHEXOL 350 MG/ML SOLN COMPARISON:  Chest x-ray earlier today. FINDINGS: CTA CHEST FINDINGS No filling defects in the pulmonary arteries to suggest pulmonary emboli. There is mild cardiomegaly. Coronary artery calcifications. Prior CABG. Aorta is normal caliber. No mediastinal, hilar, or axillary adenopathy. Moderate bilateral pleural effusions, right greater than left. There is a subpulmonic component on the left. Minimal compressive atelectasis in the lower lobes bilaterally. Peripheral densities are noted in the upper lobes bilaterally most compatible with scarring. Several scattered pulmonary nodules in the upper lobes bilaterally as well. 8 mm nodule in the left upper lobe on image 25. 9 mm nodule in the upper lobe on image 20. 8 mm nodule in the right upper lobe on image 27 with smaller subpleural nodule in on image 23. Mild peribronchial thickening. Chest wall soft tissues are unremarkable. No acute bony abnormality. CT ABDOMEN and PELVIS FINDINGS Low-density area adjacent to the falciform ligament within the liver likely represents area of focal fatty infiltration. No suspicious hepatic abnormality. Spleen, pancreas, adrenals and right kidney are unremarkable. Left lower pole parapelvic cyst which appears benign. No hydronephrosis. Aortic atherosclerotic calcifications and irregularity. No aneurysm. Stomach, small bowel decompressed and unremarkable. Circumferential wall thickening within the rectum suggesting proctitis. Remainder of the colon wall is normal. There is a small left femoral hernia noted containing the anterior wall of the sigmoid colon. No obstruction. Urinary bladder is unremarkable. There is trace free fluid in the pelvis. No free air or adenopathy. No acute bony abnormality or focal bone lesion. Review of the MIP images  confirms the above findings. IMPRESSION: No evidence of pulmonary embolus. Cardiomegaly.  Moderate bilateral pleural effusions. Peripheral densities in the upper lobes bilaterally most compatible with scarring. Bilateral upper lobe nodular densities warrant follow-up. Recommend follow-up CT in 3-6 months to assess stability. Area of circumferential wall thickening within the click rectum most compatible with infectious or inflammatory proctitis. Cancer felt less likely but cannot be completely excluded. Consider colonoscopy after acute symptoms resolve. Small left femoral hernia containing the anterior wall of the sigmoid colon. No evidence of bowel obstruction. Small amount of free fluid in the pelvis. Electronically Signed   By: Charlett Nose M.D.   On: 02/10/2015 11:35   Dg Chest Port 1 View  02/10/2015  CLINICAL DATA:  Shortness of Breath EXAM: PORTABLE CHEST 1 VIEW COMPARISON:  None. FINDINGS: There is mild bibasilar interstitial and patchy alveolar edema. Lungs elsewhere clear. Heart is enlarged with pulmonary venous hypertension. No adenopathy. Patient is status post coronary artery bypass grafting. There is degenerative change in each shoulder. IMPRESSION:  Evidence of a degree of congestive heart failure. Electronically Signed   By: Bretta Bang III M.D.   On: 02/10/2015 08:29      Impression / Plan:   Manuel Anthony is a 75 y.o. y/o male with failure to thrive and an abnormal CT scan. The patient was admitted with shortness of breath and is not in optimal condition to undergo any endoscopic procedures at this time. The patient has been given my card and has been told to contact me if now patient after his respiratory distress and congestive heart that you have resolved. The patient has been explained the plan and agrees with it. I will sign off now. Do not hesitate to call if you have any questions.  Thank you for involving me in the care of this patient.      LOS: 1 day   Darlina Rumpf,  MD  02/11/2015, 7:03 PM   Note: This dictation was prepared with Dragon dictation along with smaller phrase technology. Any transcriptional errors that result from this process are unintentional.

## 2015-02-11 NOTE — Progress Notes (Signed)
*  PRELIMINARY RESULTS* Echocardiogram 2D Echocardiogram has been performed.  Manuel Anthony 02/11/2015, 5:41 PM

## 2015-02-11 NOTE — Progress Notes (Signed)
Initial appointment made at the [facility] on February 28, 2015 at 9:00am. Thank you.

## 2015-02-11 NOTE — Progress Notes (Signed)
Informed Dr.Pyreddy that patient's potassium was 3.8 yesterday and now 3.4. New order 40 meq one-time dose. Will continue to monitor.

## 2015-02-11 NOTE — Progress Notes (Signed)
Initial Nutrition Assessment  DOCUMENTATION CODES:   Severe malnutrition in context of chronic illness, social/environmental factors likely contributing  INTERVENTION:   Meals and Snacks: Cater to patient preferences, recommend smaller, more frequent meals to maximize po intake as pt reporting early satiety with poor appetite. May benefit from liberalizing diet to Regular if po intake inadequate on follow-up. Pt tearful on visit, reports loss of wife in June of last year; may benefit from appetite stimulant such as [employer] Food Supplement Therapy: will send Ensure Enlive po TD, each supplement provides 350 kcal and 20 grams of protein'  NUTRITION DIAGNOSIS:   Malnutrition related to poor appetite, social / environmental circumstances, chronic illness as evidenced by severe depletion of body fat, severe depletion of muscle mass.  GOAL:   Patient will meet greater than or equal to 90% of their needs   MONITOR:    (Energy Intake, Anthropometrics, Electrolyte./Renal Profile, Digestive System)  REASON FOR ASSESSMENT:   Malnutrition Screening Tool, Diagnosis    ASSESSMENT:    Pt admitted with SOB, weakness, wt loss. Pt with acute CHF; GI consulted for wt loss. CT abdomen showed proctitis, CT chest with possible lung nodules. Pt very tearful on visit today, reports loss of his wife in June of 2016.   Past Medical History  Diagnosis Date  . Diabetes mellitus without complication (HCC)   . CHF (congestive heart failure) (HCC)      Diet Order:  Diet heart healthy/carb modified Room service appropriate?: Yes; Fluid consistency:: Thin   Energy Intake: recorded po intake 75% at Breakfast, 100% at [employer] and Nutrition Related History:  Pt reports poor appetite at home to the point that he does not eat sometimes because he doesn't remember too. Pt reports that sometimes he eats better than other but overall has no appetite for anything; when he does eat, he gets full  quickly. Family at bedside and they report that pt eats very minimally; for breakfast, pt drinks coffee and might eat a piece of toast. They went out for breakfast the other day and pt ate 2 bites of pancake and then was done. Pt does not use nutritional supplements; pt used to snack throughout the day but has not been doing this lately  Electrolyte and Renal Profile:  Recent Labs Lab 02/10/15 0838 02/10/15 1730 02/11/15 0512  BUN 18  --  16  CREATININE 1.02 1.10 1.06  NA 140  --  138  K 3.8  --  3.4*   Glucose Profile:   Recent Labs  02/10/15 0843 02/11/15 0749  GLUCAP 117* 130*    Meds: lasix, ss novolog, glucotrol, metformin, potassium chloride  Nutrition Focused Physical Exam: Nutrition-Focused physical exam completed. Findings are severe fat depletion, severe muscle depletion, and no edema.   Height:   Ht Readings from Last 1 Encounters:  02/10/15  (1.676 m)    Weight: pt reports UBW around 150 pounds but unsure of when he last weight this; 24% wt loss (76% UBW)  Wt Readings from Last 1 Encounters:  02/11/15 114 lb 4.8 oz (51.846 kg)    BMI:  Body mass index is 18.46 kg/(m^2).  Estimated Nutritional Needs:   Kcal:  1914-7829 kcals (BEE 1206, 1.3 AF, 1.0-1.2 IF)   Protein:  57-68 g (!.1-1.3 g/kg)   Fluid:  1300-1560 mL (25-30 ml/kg)     HIGH Care Level  Romelle Starcher MS, RD, LDN [phone] Pager  [phone] Weekend/On-Call Pager

## 2015-02-12 ENCOUNTER — Encounter: Payer: Self-pay | Admitting: Student

## 2015-02-12 DIAGNOSIS — I5021 Acute systolic (congestive) heart failure: Secondary | ICD-10-CM | POA: Insufficient documentation

## 2015-02-12 DIAGNOSIS — I25700 Atherosclerosis of coronary artery bypass graft(s), unspecified, with unstable angina pectoris: Secondary | ICD-10-CM | POA: Insufficient documentation

## 2015-02-12 DIAGNOSIS — R933 Abnormal findings on diagnostic imaging of other parts of digestive tract: Secondary | ICD-10-CM

## 2015-02-12 DIAGNOSIS — R634 Abnormal weight loss: Secondary | ICD-10-CM

## 2015-02-12 DIAGNOSIS — F32A Depression, unspecified: Secondary | ICD-10-CM | POA: Insufficient documentation

## 2015-02-12 DIAGNOSIS — J90 Pleural effusion, not elsewhere classified: Secondary | ICD-10-CM | POA: Insufficient documentation

## 2015-02-12 DIAGNOSIS — R0602 Shortness of breath: Secondary | ICD-10-CM | POA: Insufficient documentation

## 2015-02-12 DIAGNOSIS — F329 Major depressive disorder, single episode, unspecified: Secondary | ICD-10-CM | POA: Insufficient documentation

## 2015-02-12 DIAGNOSIS — E43 Unspecified severe protein-calorie malnutrition: Secondary | ICD-10-CM

## 2015-02-12 LAB — BASIC METABOLIC PANEL
Anion gap: 9 (ref 5–15)
BUN: 15 mg/dL (ref 6–20)
CALCIUM: 8.3 mg/dL — AB (ref 8.9–10.3)
CHLORIDE: 94 mmol/L — AB (ref 101–111)
CO2: 29 mmol/L (ref 22–32)
CREATININE: 0.95 mg/dL (ref 0.61–1.24)
GFR calc Af Amer: >60 mL/min (ref >60)
GFR calc non Af Amer: >60 mL/min (ref >60)
GLUCOSE: 116 mg/dL — AB (ref 65–99)
Potassium: 3.1 mmol/L — ABNORMAL LOW (ref 3.5–5.1)
Sodium: 132 mmol/L — ABNORMAL LOW (ref 135–145)

## 2015-02-12 LAB — CBC
HEMATOCRIT: 37.8 % — AB (ref 40.0–52.0)
Hemoglobin: 12.2 g/dL — ABNORMAL LOW (ref 13.0–18.0)
MCH: 25.7 pg — AB (ref 26.0–34.0)
MCHC: 32.3 g/dL (ref 32.0–36.0)
MCV: 79.6 fL — AB (ref 80.0–100.0)
PLATELETS: 360 10*3/uL (ref 150–440)
RBC: 4.75 MIL/uL (ref 4.40–5.90)
RDW: 17.7 % — AB (ref 11.5–14.5)
WBC: 7.9 10*3/uL (ref 3.8–10.6)

## 2015-02-12 LAB — GLUCOSE, CAPILLARY
GLUCOSE-CAPILLARY: 140 mg/dL — AB (ref 65–99)
Glucose-Capillary: 138 mg/dL — ABNORMAL HIGH (ref 65–99)
Glucose-Capillary: 87 mg/dL (ref 65–99)
Glucose-Capillary: 111 mg/dL — ABNORMAL HIGH (ref 65–99)

## 2015-02-12 LAB — MAGNESIUM: Magnesium: 1.4 mg/dL — ABNORMAL LOW (ref 1.7–2.4)

## 2015-02-12 MED ORDER — MAGNESIUM SULFATE IN D5W 10-5 MG/ML-% IV SOLN
1.0000 g | Freq: Once | INTRAVENOUS | Status: AC
  Administered 2015-02-12: 1 g via INTRAVENOUS
  Filled 2015-02-12: qty 100

## 2015-02-12 MED ORDER — POTASSIUM CHLORIDE CRYS ER 20 MEQ PO TBCR
40.0000 meq | EXTENDED_RELEASE_TABLET | Freq: Once | ORAL | Status: AC
  Administered 2015-02-12: 40 meq via ORAL
  Filled 2015-02-12: qty 2

## 2015-02-12 NOTE — Care Management Note (Signed)
Case Management Note  Patient Details  Name: Manuel Anthony MRN: [id number] Date of Birth: 07/07/1940  Subjective/Objective:  RNCM assessment for discharge planning. Met with patient and his son at bedside. New onset CHF. On room air and receiving IV lasix.  Patient states he lives alone. He uses no DME.  He has 2 supportive sons that are about 10 minutes away. He has a daughter in law that lives in [place] and comes to see patient about 2 times per week. Patient tearful for fear he will be a burden on his family. Reassurance given.  He does not drive, has no home O2 or home health. His PCP is at the [place] which is where he gets his medications.                 Action/Plan: Following progression. CM contact card given.   Expected Discharge Date:                  Expected Discharge Plan:     In-House Referral:     Discharge planning Services  CM Consult  Post Acute Care Choice:    Choice offered to:     DME Arranged:    DME Agency:     HH Arranged:    HH Agency:     Status of Service:  In process, will continue to follow  Medicare Important Message Given:    Date Medicare IM Given:    Medicare IM give by:    Date Additional Medicare IM Given:    Additional Medicare Important Message give by:     If discussed at [place] of Stay Meetings, dates discussed:    Additional Comments:  Jolly Mango, RN 02/12/2015, 10:15 AM

## 2015-02-12 NOTE — Progress Notes (Signed)
Patient BP in 80's SBP. Dr. Elisabeth Pigeon aware, ordered to hold lasix. Patient is asymptomatic at this time.  Trudee Kuster

## 2015-02-12 NOTE — Clinical Documentation Improvement (Signed)
Internal Medicine  Can the diagnosis of CHF be further specified?    Acuity - Acute, Chronic, Acute on Chronic   Type - Systolic, Diastolic, Systolic and Diastolic  Other  Clinically Undetermined   Document any associated diagnoses/conditions   Supporting Information: H&P: "acute CHF initial onset unknown systolic or diastolic: Check echocardiogram, start IV Lasix, monitor on telemetry, check daily weights, fluid restriction up to 1-1/2 L a day." History of coronary artery disease now with congestive heart failure: ECHO: 02/11/15 Study Conclusions  - Left ventricle: The cavity size was moderately dilated. Systolic function was severely reduced. The estimated ejection fraction was in the range of 20% to 25%. Diffuse hypokinesis. Regional wall motion abnormalities cannot be excluded. The study is not technically sufficient to allow evaluation of LV diastolic function. - Mitral valve: There was mild to moderate regurgitation. - Right ventricle: The cavity size was mildly dilated. Wall thickness was normal. Systolic function was moderately reduced. - Right atrium: The atrium was mildly dilated. - Pulmonary arteries: Systolic pressure was mild to moderately dilated. PA peak pressure: 49 mm Hg (S).  Impressions:  - Left pleural effusion noted.   Please exercise your independent, professional judgment when responding. A specific answer is not anticipated or expected. Please update your documentation within the medical record to reflect your response to this query. Thank you  Thank Barrie Dunker Health Information Management [place] [phone]

## 2015-02-12 NOTE — Clinical Documentation Improvement (Signed)
Internal Medicine  Abnormal Lab/Test Results:   Component     Latest Ref Rng 02/11/2015 02/12/2015           Potassium     3.5 - 5.1 mmol/L 3.4 (L) 3.1 (L)    Possible Clinical Conditions associated with below indicators  Hypokalemia  Other Condition  Cannot Clinically Determine   Supporting Information: Nursing  Progress note 02/11/15:"potassium was 3.8 yesterday and now 3.4. New order 40 meq one-time dose. Will continue to monitor."  Treatment Provided: Potassium Chloride SA  40 mEq  Once 02/12/15 0630  Please exercise your independent, professional judgment when responding. A specific answer is not anticipated or expected. Please update your documentation within the medical record to reflect your response to this query. Thank you  Thank Barrie Dunker Health Information Management [place] [phone]

## 2015-02-12 NOTE — Evaluation (Signed)
Physical Therapy Evaluation Patient Details Name: Manuel Anthony MRN: [id number] DOB: 01/11/1941 Today's Date: 02/12/2015   History of Present Illness  presented to ER with progressive weakness, SOB; admitted with acute CHF, initial onset.  Scheduled for cardiac cath next date per RN.   Clinical Impression  Upon evaluation, patient alert and oriented to self, location only; generally confused with noted deficits in STM, safety awareness/insight into deficits.  Currently requiring min assist for all functional mobility with use of RW (sit/stand, basic transfers and gait); very narrowed BOS, occasionally scissoring, requiring min assist from therapist to prevent fall.  Marked improvement in safety/stability with use of RW (improved to cga/close sup) for additional mobility efforts; recommend continued use of RW with all mobility at this time.  Family voiced awareness and understanding. Patient on 2L supplemental O2 throughout session--desat to mid-80s with exertion requiring seated rest periods for recovery.  Patient with absent awareness of need for activity pacing, monitoring or importance of O2. Would benefit from skilled PT to address above deficits and promote optimal return to PLOF; Recommend transition to HHPT upon discharge from acute hospitalization.     Follow Up Recommendations Home health PT;Supervision/Assistance - 24 hour (per family, willing/able to provide 24 hour sup/assist upon discharge)    Equipment Recommendations  Rolling walker with 5" wheels    Recommendations for Other Services       Precautions / Restrictions Precautions Precautions: Fall Restrictions Weight Bearing Restrictions: No      Mobility  Bed Mobility Overal bed mobility: Independent                Transfers Overall transfer level: Needs assistance Equipment used: None Transfers: Sit to/from Stand Sit to Stand: Min assist            Ambulation/Gait Ambulation/Gait assistance: Min  assist Ambulation Distance (Feet): 90 Feet Assistive device: None       General Gait Details: narrowed BOS with LEs occasionally crossing midline; veers laterally at times, somewhat staggering, reaching for walls/rails for external stabilization  Stairs            Wheelchair Mobility    Modified Rankin (Stroke Patients Only)       Balance Overall balance assessment: Needs assistance Sitting-balance support: No upper extremity supported;Feet supported Sitting balance-Leahy Scale: Normal     Standing balance support: No upper extremity supported Standing balance-Leahy Scale: Fair                               Pertinent Vitals/Pain Pain Assessment: No/denies pain    Home Living Family/patient expects to be discharged to:: Private residence Living Arrangements: Alone Available Help at Discharge: Family (per family, can coordinate 24 hour sup/assist upon discharge if needed) Type of Home: House Home Access: Stairs to enter Entrance Stairs-Rails: Right Entrance Stairs-Number of Steps: 3 Home Layout: One level        Prior Function Level of Independence: Independent         Comments: Indep with ADLs and household mobility; denies fall history.  Approx 6 months ago, was able to ambulate distances 2-3 miles at a time without difficulty (progressive decline since).  Sons provide frequent check in and provide transportation/assist with errands as needed.     Hand Dominance        Extremity/Trunk Assessment   Upper Extremity Assessment: Overall WFL for tasks assessed           Lower Extremity Assessment:  Overall WFL for tasks assessed         Communication   Communication: No difficulties  Cognition Arousal/Alertness: Awake/alert Behavior During Therapy: WFL for tasks assessed/performed Overall Cognitive Status: History of cognitive impairments - at baseline (oriented to self, location only; generally confused to situation; poor  insight/safety awareness)       Memory: Decreased short-term memory              General Comments      Exercises Other Exercises Other Exercises: Gait x90' with RW, cga/close sup--improved safety, stability and overall gait mechanics with use of RW; recommend continued use at this time.  Family aware and in agreement.      Assessment/Plan    PT Assessment Patient needs continued PT services  PT Diagnosis Difficulty walking;Generalized weakness   PT Problem List Decreased activity tolerance;Decreased balance;Decreased mobility;Decreased cognition;Decreased knowledge of use of DME;Decreased safety awareness;Decreased knowledge of precautions;Cardiopulmonary status limiting activity  PT Treatment Interventions DME instruction;Gait training;Stair training;Functional mobility training;Therapeutic activities;Therapeutic exercise;Balance training;Patient/family education   PT Goals (Current goals can be found in the Care Plan section) Acute Rehab PT Goals Patient Stated Goal: to return home with home therapy and family support PT Goal Formulation: With patient/family Time For Goal Achievement: 02/26/15 Potential to Achieve Goals: Good    Frequency Min 2X/week   Barriers to discharge        Co-evaluation               End of Session Equipment Utilized During Treatment: Gait belt Activity Tolerance: Patient tolerated treatment well Patient left: in bed;with call bell/phone within reach;with bed alarm set;with family/visitor present Nurse Communication: Mobility status         Time: 1610-9604 PT Time Calculation (min) (ACUTE ONLY): 30 min   Charges:   PT Evaluation $PT Eval Moderate Complexity: 1 Procedure PT Treatments $Gait Training: 8-22 mins   PT G Codes:        Norina Cowper H. Manson Passey, PT, DPT, NCS 02/12/2015, 4:02 PM [phone]

## 2015-02-12 NOTE — Progress Notes (Signed)
Spoke with son Fayrene Fearing) about scheduled cardiac catherization tomorrow morning. Agrees with intervention and stated that cardiology PA had called to explain procedure earlier and demonstrated understanding. Son stated he would be at hospital later this evening when he was off work and would sign consent. Patient only alert and oriented to self and does not understand procedure even after explanation from Dr. Mariah Milling and myself. Patient has documented history of advancing dementia.  Trudee Kuster

## 2015-02-12 NOTE — Consult Note (Signed)
CARDIOLOGY CONSULT NOTE   Patient ID: Manuel Anthony MRN: [id number], DOB/AGE: 1940-06-17   Admit date: 02/10/2015 Date of Consult: 02/12/2015 Reason for Consult: CHF Physician Requesting Consult: Dr. Elisabeth Pigeon   Primary Physician: No PCP Per Patient Primary Cardiologist: New to [facility]  HPI: Manuel Anthony is a 75 y.o. male with past medical history of CAD (s/p CABG in 2003), HTN, HLD and Type 2 DM who presented to [facility] on 02/10/2015 with worsening dyspnea over the past two days.  The patient reports having worsening dyspnea for the past few months which has significantly worsened over the past week. The dyspnea is worse with physical exertion, even walking on flat surfaces. He reports having a cold at the time of Thanksgiving 2016 and says his breathing has not been the same since. His dyspnea is now occurring at rest, which prompted him to come in for evaluation. He denies any recent orthopnea or PND. Denies any previous episodes of chest pain.   The patient also reports a significant weight loss since June of last year when he lost his wife. He denies any recent hemoptysis, melena, or hematochezia. Has been consuming less food since the loss of his wife. He was seen by GI for this who recommended outpatient follow-up once his acute respiratory symptoms resolve.  While admitted, his BNP was found to be elevated to 1593. Creatinine stable at 1.02. WBC 6.7. Hgb 12.8. Platelets 372. Cyclic troponin values have been negative. EKG showed sinus tachycardia with TWI in the inferior and lateral leads. CTA was performed which showed no evidence of PE. Bilateral upper lobe nodules were noted with recommended follow-up CT in 3 months. Wall thickening of the rectum was noted with recommendation for colonoscopy once symptoms improve. Coronary calcifications were also seen. An echocardiogram was performed on 02/11/2015 which showed an EF of 20-25% with diffuse hypokinesis. PA pressure was elevated to  49.  He was started on IV lasix  BID at time of admission with a net output of -4.0 L so far this admission. He reports some improvement in his dyspnea but says it is still not at baseline.   Reports a cardiac history with prior CABG in 2003. This was performed at [facility]. The surgical records are not available in Care Everywhere but his cath report at the time is which showed 99% stenosis in the  proximal RCA, 99% stenosis in the LAD, and 90% stenosis in the LCx. He was followed by a Cardiologist at the [place] but reports his Cardiologist passed away several years ago and now he only sees a PCP.  Currently lives by himself with family nearby. Reports performing all of his ADL's independently.  Problem List Past Medical History  Diagnosis Date  . Diabetes mellitus without complication (HCC)   . CHF (congestive heart failure) (HCC)   . CAD (coronary artery disease)     a. CABG in 2003 following cath which showed 99% stenosis RCA, 99% LAD and 90% LCx  . Chronic systolic CHF (congestive heart failure) (HCC)     a. 02/11/2012: EF 20-25% by echo with diffuse hypokinesis  . HLD (hyperlipidemia)     Past Surgical History  Procedure Laterality Date  . Cardiac surgery    . Hernia repair       Allergies Allergies  Allergen Reactions  . Penicillins Other (See Comments)    "passes out"     Inpatient Medications . aspirin EC  81 mg Oral Daily  . cholecalciferol  1,000 Units Oral  Daily  . donepezil  10 mg Oral QHS  . enoxaparin (LOVENOX) injection  40 mg Subcutaneous Q24H  . feeding supplement (ENSURE ENLIVE)  237 mL Oral TID BM  . furosemide  40 mg Intravenous BID  . glipiZIDE  10 mg Oral QAC breakfast  . Influenza vac split quadrivalent PF  0.5 mL Intramuscular Tomorrow-1000  . insulin aspart  0-9 Units Subcutaneous TID WC  . pneumococcal 23 valent vaccine  0.5 mL Intramuscular Tomorrow-1000  . pravastatin  80 mg Oral Daily    Family History Family History  Problem  Relation Age of Onset  . Coronary artery disease Father   . Hypertension Father   . Heart attack Paternal Grandfather      Social History Social History   Social History  . Marital Status: Widowed    Spouse Name: N/A  . Number of Children: N/A  . Years of Education: N/A   Occupational History  . Not on file.   Social History Main Topics  . Smoking status: Former Smoker -- 1.00 packs/day for 20 years    Types: Cigarettes    Quit date: 02/12/1995  . Smokeless tobacco: Not on file  . Alcohol Use: No  . Drug Use: No  . Sexual Activity: Not on file   Other Topics Concern  . Not on file   Social History Narrative     Review of Systems General:  No chills, fever, or night sweats. Positive for weight loss.  Cardiovascular:  No chest pain, edema, orthopnea, palpitations, paroxysmal nocturnal dyspnea. Positive for dyspnea on exertion. Dermatological: No rash, lesions/masses Respiratory: No cough, Positive for dyspnea. Urologic: No hematuria, dysuria Abdominal:   No nausea, vomiting, diarrhea, bright red blood per rectum, melena, or hematemesis Neurologic:  No visual changes, wkns, changes in mental status. All other systems reviewed and are otherwise negative except as noted above.  Physical Exam Blood pressure 85/63, pulse 101, temperature 97.4 F (36.3 C), temperature source Oral, resp. rate 18, height  (1.676 m), weight 114 lb (51.71 kg), SpO2 85 %.  General: Pleasant, thin-appearing elderly Caucasian male appearing in NAD. Psych: Normal affect. Neuro: Alert and oriented X 3. Moves all extremities spontaneously. HEENT: Normal  Neck: Supple without bruits or JVD. Lungs:  Resp regular and somewhat labored, minimal rales at bases bilaterally. Heart: RRR no s3, s4, or murmurs. Abdomen: Soft, non-tender, non-distended, BS + x 4.  Extremities: No clubbing, cyanosis or edema. DP/PT/Radials 2+ and equal bilaterally.  Labs  Recent Labs  02/10/15 0838 02/10/15 1346  02/10/15 1939 02/11/15 0137  TROPONINI <0.03 0.03 0.03 <0.03   Lab Results  Component Value Date   WBC 7.9 02/12/2015   HGB 12.2* 02/12/2015   HCT 37.8* 02/12/2015   MCV 79.6* 02/12/2015   PLT 360 02/12/2015    Recent Labs Lab 02/10/15 0838  02/12/15 0401  NA 140  < > 132*  K 3.8  < > 3.1*  CL 106  < > 94*  CO2 24  < > 29  BUN 18  < > 15  CREATININE 1.02  < > 0.95  CALCIUM 9.0  < > 8.3*  PROT 7.6  --   --   BILITOT 1.3*  --   --   ALKPHOS 69  --   --   ALT 14*  --   --   AST 19  --   --   GLUCOSE 133*  < > 116*  < > = values in this interval not displayed.  No results found for: CHOL, HDL, LDLCALC, TRIG No results found for: DDIMER  Radiology/Studies: Ct Abdomen Pelvis W Contrast: 02/10/2015  CLINICAL DATA:  Worsening shortness of breath over the past few days. Weakness. EXAM: CT ANGIOGRAPHY CHEST CT ABDOMEN AND PELVIS WITH CONTRAST TECHNIQUE: Multidetector CT imaging of the chest was performed using the standard protocol during bolus administration of intravenous contrast. Multiplanar CT image reconstructions and MIPs were obtained to evaluate the vascular anatomy. Multidetector CT imaging of the abdomen and pelvis was performed using the standard protocol during bolus administration of intravenous contrast. CONTRAST:  OMNIPAQUE IOHEXOL 350 MG/ML SOLN COMPARISON:  Chest x-ray earlier today. FINDINGS: CTA CHEST FINDINGS No filling defects in the pulmonary arteries to suggest pulmonary emboli. There is mild cardiomegaly. Coronary artery calcifications. Prior CABG. Aorta is normal caliber. No mediastinal, hilar, or axillary adenopathy. Moderate bilateral pleural effusions, right greater than left. There is a subpulmonic component on the left. Minimal compressive atelectasis in the lower lobes bilaterally. Peripheral densities are noted in the upper lobes bilaterally most compatible with scarring. Several scattered pulmonary nodules in the upper lobes bilaterally as well. 8 mm  nodule in the left upper lobe on image 25. 9 mm nodule in the upper lobe on image 20. 8 mm nodule in the right upper lobe on image 27 with smaller subpleural nodule in on image 23. Mild peribronchial thickening. Chest wall soft tissues are unremarkable. No acute bony abnormality. CT ABDOMEN and PELVIS FINDINGS Low-density area adjacent to the falciform ligament within the liver likely represents area of focal fatty infiltration. No suspicious hepatic abnormality. Spleen, pancreas, adrenals and right kidney are unremarkable. Left lower pole parapelvic cyst which appears benign. No hydronephrosis. Aortic atherosclerotic calcifications and irregularity. No aneurysm. Stomach, small bowel decompressed and unremarkable. Circumferential wall thickening within the rectum suggesting proctitis. Remainder of the colon wall is normal. There is a small left femoral hernia noted containing the anterior wall of the sigmoid colon. No obstruction. Urinary bladder is unremarkable. There is trace free fluid in the pelvis. No free air or adenopathy. No acute bony abnormality or focal bone lesion. Review of the MIP images confirms the above findings. IMPRESSION: No evidence of pulmonary embolus. Cardiomegaly.  Moderate bilateral pleural effusions. Peripheral densities in the upper lobes bilaterally most compatible with scarring. Bilateral upper lobe nodular densities warrant follow-up. Recommend follow-up CT in 3-6 months to assess stability. Area of circumferential wall thickening within the click rectum most compatible with infectious or inflammatory proctitis. Cancer felt less likely but cannot be completely excluded. Consider colonoscopy after acute symptoms resolve. Small left femoral hernia containing the anterior wall of the sigmoid colon. No evidence of bowel obstruction. Small amount of free fluid in the pelvis. Electronically Signed   By: Charlett Nose M.D.   On: 02/10/2015 11:35   Dg Chest Port 1 View: 02/10/2015  CLINICAL  DATA:  Shortness of Breath EXAM: PORTABLE CHEST 1 VIEW COMPARISON:  None. FINDINGS: There is mild bibasilar interstitial and patchy alveolar edema. Lungs elsewhere clear. Heart is enlarged with pulmonary venous hypertension. No adenopathy. Patient is status post coronary artery bypass grafting. There is degenerative change in each shoulder. IMPRESSION: Evidence of a degree of congestive heart failure. Electronically Signed   By: Bretta Bang III M.D.   On: 02/10/2015 08:29    ECG: Sinus tachycardia, HR 113, TWI in inferior and lateral leads.   ECHOCARDIOGRAM: 02/11/2015 Study Conclusions - Left ventricle: The cavity size was moderately dilated. Systolic function was severely reduced. The  estimated ejection fraction was in the range of 20% to 25%. Diffuse hypokinesis. Regional wall motion abnormalities cannot be excluded. The study is not technically sufficient to allow evaluation of LV diastolic function. - Mitral valve: There was mild to moderate regurgitation. - Right ventricle: The cavity size was mildly dilated. Wall thickness was normal. Systolic function was moderately reduced. - Right atrium: The atrium was mildly dilated. - Pulmonary arteries: Systolic pressure was mild to moderately dilated. PA peak pressure: 49 mm Hg (S).  Impressions: - Left pleural effusion noted.  ASSESSMENT AND PLAN  1. Acute on Chronic Systolic CHF - presented with worsening exertional dyspnea over the past 3 months. Had developed dyspnea at rest over the past week. - BNP elevated to 1593 on admission. Started on IV lasix 40mg  BID at time of admission with a net output of -4.0 L so far this admission. He reports some improvement in his dyspnea but says it is still not at baseline.  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 Following discussion with Dr. Mariah Milling, a right and left-sided cardiac catheterization  was recommended due to his worsening dyspnea, decreased EF, and ischemic changes noted on his EKG. The risks and benefits of the procedure were discussed with the patient and he agrees to proceed. He was A&O x 3 today but with his history of dementia and with the patient's consent his son, Fayrene Fearing, was called and informed of the above information and recommendations. He agreed his father should proceed with the procedure as well. It is tentatively scheduled for 0830 on 02/13/2015. He will be NPO after midnight. Metformin will be held as discussed below.  2. History of CAD - s/p CABG in 2003. Cath at that time showed 99% stenosis in the  proximal RCA, 99% stenosis in the LAD, and 90% stenosis in the LCx. Has not seen a Cardiologist in 5+ years. - denies any recent chest pain. Does endorse worsening dyspnea with exertion over the past three months. - cyclic troponin values have been negative. - EKG shows Sinus tachycardia with TWI in the inferior and lateral leads. - continue ASA and Statin. No BB or ACE-I currently due to hypotension.  3. Hypotension - reports a history of HTN but has been hypotensive while admitted.  - not on any BP medications as an outpatient - BP has been 82/52 - 94/65 in the past 24 hours.  4. HLD - will check Lipid Panel - continue statin therapy  5. Type 2 DM - Was on Metformin and SSI while admitted. - Metformin discontinued due to upcoming cath. Will need to be held 24 hours before cath and 48 hours after to reduce risk of acidosis. Last dose was at 0838 on 02/12/2015.  6. Early-Stage Dementia - A&Ox 3 today, yet he did not initially remember having an echocardiogram yesterday. - continue PTA Aricept - Spoke to the patient's son Fayrene Fearing) who confirmed this. Reports his father's memory has only started getting worse over the past few months. He still lives independently and performs his own ADL's. Still makes his own medical decisions at this time.  7. Weight Loss/ Wall  thickening of Rectum on CT - reports a significant weight loss since June of last year when he lost his wife. - Denies any recent hemoptysis, melena, or hematochezia.  - Seen by GI in consult who recommended outpatient follow-up once his acute respiratory symptoms resolve.  8. Bilateral upper lobe nodules -  noted on recent CT - follow-up CT in 3 months recommended.  9. Hypokalemia - K+ 3.1 on 02/12/2015 - being replaced.  Signed, Ellsworth Lennox, PA-C 02/12/2015, 4:20 PM Pager: [phone]

## 2015-02-12 NOTE — Plan of Care (Signed)
Problem: Cardiac: Goal: Ability to achieve and maintain adequate cardiopulmonary perfusion will improve Outcome: Progressing Pt remains ST 100-110s, SBP 90s, on RA SpO2 > 90%, Pt A&O x 1, son states pt has had dementia for about a year that has progressed since his wife died this summer.

## 2015-02-13 ENCOUNTER — Encounter
Admission: EM | Disposition: A | Discharge status: Skilled Nursing Facility | Payer: Self-pay | Source: Home / Self Care | Attending: Internal Medicine

## 2015-02-13 DIAGNOSIS — I200 Unstable angina: Secondary | ICD-10-CM | POA: Insufficient documentation

## 2015-02-13 HISTORY — PX: CARDIAC CATHETERIZATION: SHX172

## 2015-02-13 LAB — CBC
HCT: 38.2 % — ABNORMAL LOW (ref 40.0–52.0)
Hemoglobin: 12.2 g/dL — ABNORMAL LOW (ref 13.0–18.0)
MCH: 25.2 pg — AB (ref 26.0–34.0)
MCHC: 31.9 g/dL — AB (ref 32.0–36.0)
MCV: 79.2 fL — AB (ref 80.0–100.0)
PLATELETS: 370 10*3/uL (ref 150–440)
RBC: 4.82 MIL/uL (ref 4.40–5.90)
RDW: 17.6 % — AB (ref 11.5–14.5)
WBC: 6 10*3/uL (ref 3.8–10.6)

## 2015-02-13 LAB — BASIC METABOLIC PANEL
ANION GAP: 5 (ref 5–15)
BUN: 23 mg/dL — ABNORMAL HIGH (ref 6–20)
CO2: 30 mmol/L (ref 22–32)
Calcium: 8.8 mg/dL — ABNORMAL LOW (ref 8.9–10.3)
Chloride: 100 mmol/L — ABNORMAL LOW (ref 101–111)
Creatinine, Ser: 0.8 mg/dL (ref 0.61–1.24)
GFR calc non Af Amer: >60 mL/min (ref >60)
GLUCOSE: 151 mg/dL — AB (ref 65–99)
POTASSIUM: 3.8 mmol/L (ref 3.5–5.1)
Sodium: 135 mmol/L (ref 135–145)

## 2015-02-13 LAB — MAGNESIUM: Magnesium: 1.8 mg/dL (ref 1.7–2.4)

## 2015-02-13 LAB — LIPID PANEL
CHOLESTEROL: 84 mg/dL (ref 0–200)
HDL: 28 mg/dL — AB (ref >40)
LDL CALC: 47 mg/dL (ref 0–99)
TRIGLYCERIDES: 47 mg/dL (ref <150)
Total CHOL/HDL Ratio: 3 RATIO
VLDL: 9 mg/dL (ref 0–40)

## 2015-02-13 LAB — GLUCOSE, CAPILLARY
GLUCOSE-CAPILLARY: 106 mg/dL — AB (ref 65–99)
GLUCOSE-CAPILLARY: 141 mg/dL — AB (ref 65–99)
GLUCOSE-CAPILLARY: 222 mg/dL — AB (ref 65–99)
GLUCOSE-CAPILLARY: 96 mg/dL (ref 65–99)
Glucose-Capillary: 146 mg/dL — ABNORMAL HIGH (ref 65–99)

## 2015-02-13 LAB — CREATININE, SERUM
Creatinine, Ser: 0.84 mg/dL (ref 0.61–1.24)
GFR calc Af Amer: >60 mL/min (ref >60)
GFR calc non Af Amer: >60 mL/min (ref >60)

## 2015-02-13 SURGERY — RIGHT AND LEFT HEART CATH
Anesthesia: Moderate Sedation | Laterality: Right

## 2015-02-13 MED ORDER — SODIUM CHLORIDE 0.9 % IV SOLN: 250.0000 mL | INTRAVENOUS | Status: DC | PRN

## 2015-02-13 MED ORDER — CLOPIDOGREL BISULFATE 75 MG PO TABS
ORAL_TABLET | ORAL | Status: AC
  Filled 2015-02-13: qty 8

## 2015-02-13 MED ORDER — ASPIRIN 81 MG PO CHEW
CHEWABLE_TABLET | ORAL | Status: DC | PRN
  Administered 2015-02-13: 324 mg via ORAL

## 2015-02-13 MED ORDER — HALOPERIDOL LACTATE 5 MG/ML IJ SOLN
5.0000 mg | Freq: Once | INTRAMUSCULAR | Status: AC
  Administered 2015-02-14: 5 mg via INTRAVENOUS
  Filled 2015-02-13: qty 1

## 2015-02-13 MED ORDER — FENTANYL CITRATE (PF) 100 MCG/2ML IJ SOLN
INTRAMUSCULAR | Status: AC
  Filled 2015-02-13: qty 2

## 2015-02-13 MED ORDER — SODIUM CHLORIDE 0.9 % IV SOLN
INTRAVENOUS | Status: DC
  Administered 2015-02-14: via INTRAVENOUS

## 2015-02-13 MED ORDER — CLOPIDOGREL BISULFATE 75 MG PO TABS
75.0000 mg | ORAL_TABLET | Freq: Every day | ORAL | Status: DC
  Administered 2015-02-14 – 2015-02-15 (×2): 75 mg via ORAL
  Filled 2015-02-13 (×2): qty 1

## 2015-02-13 MED ORDER — HEPARIN (PORCINE) IN NACL 2-0.9 UNIT/ML-% IJ SOLN
INTRAMUSCULAR | Status: AC
  Filled 2015-02-13: qty 500

## 2015-02-13 MED ORDER — SODIUM CHLORIDE 0.9 % IV SOLN
250.0000 mg | INTRAVENOUS | Status: DC | PRN
  Administered 2015-02-13: 1.75 mg/kg/h via INTRAVENOUS

## 2015-02-13 MED ORDER — CLOPIDOGREL BISULFATE 75 MG PO TABS
ORAL_TABLET | ORAL | Status: DC | PRN
  Administered 2015-02-13: 600 mg via ORAL

## 2015-02-13 MED ORDER — ENOXAPARIN SODIUM 40 MG/0.4ML ~~LOC~~ SOLN
40.0000 mg | SUBCUTANEOUS | Status: DC
  Administered 2015-02-14 – 2015-02-15 (×2): 40 mg via SUBCUTANEOUS
  Filled 2015-02-13 (×2): qty 0.4

## 2015-02-13 MED ORDER — ADENOSINE 6 MG/2ML IV SOLN
INTRAVENOUS | Status: AC
  Filled 2015-02-13: qty 2

## 2015-02-13 MED ORDER — ADENOSINE 6 MG/2ML IV SOLN
INTRAVENOUS | Status: DC | PRN
  Administered 2015-02-13: 96 ug via INTRAVENOUS
  Administered 2015-02-13: 120 ug via INTRAVENOUS
  Administered 2015-02-13: 96 ug via INTRAVENOUS

## 2015-02-13 MED ORDER — HEPARIN (PORCINE) IN NACL 2-0.9 UNIT/ML-% IJ SOLN
INTRAMUSCULAR | Status: AC
  Filled 2015-02-13: qty 1000

## 2015-02-13 MED ORDER — BIVALIRUDIN BOLUS VIA INFUSION - CUPID
INTRAVENOUS | Status: DC | PRN
  Administered 2015-02-13: 39.225 mg via INTRAVENOUS

## 2015-02-13 MED ORDER — ASPIRIN 81 MG PO CHEW
CHEWABLE_TABLET | ORAL | Status: AC
  Filled 2015-02-13: qty 4

## 2015-02-13 MED ORDER — IOHEXOL 300 MG/ML  SOLN
INTRAMUSCULAR | Status: DC | PRN
  Administered 2015-02-13: 70 mL via INTRA_ARTERIAL
  Administered 2015-02-13: 150 mL via INTRA_ARTERIAL

## 2015-02-13 MED ORDER — MIDAZOLAM HCL 2 MG/2ML IJ SOLN
INTRAMUSCULAR | Status: AC
  Filled 2015-02-13: qty 2

## 2015-02-13 MED ORDER — SODIUM CHLORIDE 0.9% FLUSH: 3.0000 mL | INTRAVENOUS | Status: DC | PRN

## 2015-02-13 MED ORDER — NITROGLYCERIN 5 MG/ML IV SOLN
INTRAVENOUS | Status: AC
  Filled 2015-02-13: qty 10

## 2015-02-13 MED ORDER — POTASSIUM CHLORIDE CRYS ER 20 MEQ PO TBCR
40.0000 meq | EXTENDED_RELEASE_TABLET | Freq: Once | ORAL | Status: AC
  Administered 2015-02-13: 40 meq via ORAL
  Filled 2015-02-13: qty 2

## 2015-02-13 MED ORDER — ASPIRIN 81 MG PO CHEW
81.0000 mg | CHEWABLE_TABLET | ORAL | Status: AC
  Administered 2015-02-14: 81 mg via ORAL
  Filled 2015-02-13: qty 1

## 2015-02-13 MED ORDER — BIVALIRUDIN 250 MG IV SOLR
INTRAVENOUS | Status: AC
  Filled 2015-02-13: qty 250

## 2015-02-13 MED ORDER — SODIUM CHLORIDE 0.9% FLUSH
3.0000 mL | Freq: Two times a day (BID) | INTRAVENOUS | Status: DC
  Administered 2015-02-13 – 2015-02-14 (×2): 3 mL via INTRAVENOUS

## 2015-02-13 MED ORDER — FENTANYL CITRATE (PF) 100 MCG/2ML IJ SOLN
INTRAMUSCULAR | Status: DC | PRN
  Administered 2015-02-13: 25 ug via INTRAVENOUS

## 2015-02-13 SURGICAL SUPPLY — 23 items
CATH INFINITI 5 FR IM (CATHETERS) ×4 IMPLANT
CATH INFINITI 5FR ANG PIGTAIL (CATHETERS) ×4 IMPLANT
CATH INFINITI 5FR JL4 (CATHETERS) ×4 IMPLANT
CATH INFINITI JR4 5F (CATHETERS) ×4 IMPLANT
CATH SWANZ 7F THERMO (CATHETERS) ×4 IMPLANT
CATH VISTA GUIDE 6FR AL1 (CATHETERS) ×4 IMPLANT
CATH VISTA GUIDE 6FR LCB (CATHETERS) ×4 IMPLANT
DEVICE CLOSURE MYNXGRIP 6/7F (Vascular Products) ×8 IMPLANT
DEVICE INFLAT 30 PLUS (MISCELLANEOUS) ×4 IMPLANT
DEVICE SPIDERFX EMB PROT 4MM (WIRE) ×4 IMPLANT
GUIDEWIRE EMER 3M J .025X150CM (WIRE) ×4 IMPLANT
KIT MANI 3VAL PERCEP (MISCELLANEOUS) ×4 IMPLANT
KIT RIGHT HEART (MISCELLANEOUS) ×4 IMPLANT
NEEDLE PERC 18GX7CM (NEEDLE) ×4 IMPLANT
NEEDLE SMART 18G ACCESS (NEEDLE) IMPLANT
PACK CARDIAC CATH (CUSTOM PROCEDURE TRAY) ×4 IMPLANT
SHEATH AVANTI 5FR X 11CM (SHEATH) ×4 IMPLANT
SHEATH PINNACLE 6F 10CM (SHEATH) ×4 IMPLANT
SHEATH PINNACLE 7F 10CM (SHEATH) ×4 IMPLANT
STENT XIENCE ALPINE RX 3.25X18 (Permanent Stent) ×4 IMPLANT
WIRE EMERALD 3MM-J .035X150CM (WIRE) ×4 IMPLANT
WIRE EMERALD 3MM-J .035X260CM (WIRE) ×4 IMPLANT
WIRE RUNTHROUGH .014X180CM (WIRE) ×4 IMPLANT

## 2015-02-13 NOTE — Progress Notes (Signed)
PT Cancellation Note  Patient Details Name: Manuel Anthony MRN: [id number] DOB: 05-04-1940   Cancelled Treatment:    Reason Eval/Treat Not Completed: Patient at procedure or test/unavailable (Patient currently off unit for cardiac catheterization.  Will re-attempt at later time/date as patient available and medically appropriate.)   [name] H. Manson Passey, PT, DPT, NCS 02/13/2015, 10:37 AM [phone]

## 2015-02-13 NOTE — Progress Notes (Signed)
[facility] - [place] at [facility]   PATIENT NAME: Manuel Anthony    MR#:  [id number]  DATE OF BIRTH:  01-24-1940  SUBJECTIVE:  CHIEF COMPLAINT:   Chief Complaint  Patient presents with  . Shortness of Breath  . Weakness     Had worsening orthopnea, lose of weight.    Lasix held as BP was in lower side, continues to stay SOB on minimal exertion.   Waiting for cardiac catheterization today. His son is in the room.   REVIEW OF SYSTEMS:  CONSTITUTIONAL: No fever, positive for fatigue or weakness.  EYES: No blurred or double vision.  EARS, NOSE, AND THROAT: No tinnitus or ear pain.  RESPIRATORY: No cough, positive for shortness of breath, no wheezing or hemoptysis.  CARDIOVASCULAR: No chest pain, positive for orthopnea, minimal edema.  GASTROINTESTINAL: No nausea, vomiting, diarrhea or abdominal pain.  GENITOURINARY: No dysuria, hematuria.  ENDOCRINE: No polyuria, nocturia,  HEMATOLOGY: No anemia, easy bruising or bleeding SKIN: No rash or lesion. MUSCULOSKELETAL: No joint pain or arthritis.   NEUROLOGIC: No tingling, numbness, weakness.  PSYCHIATRY: No anxiety or depression.   ROS  DRUG ALLERGIES:   Allergies  Allergen Reactions  . Penicillins Other (See Comments)    "passes out"    VITALS:  Blood pressure 78/54, pulse 101, temperature 98.3 F (36.8 C), temperature source Oral, resp. rate 16, height  (1.676 m), weight 52.345 kg (115 lb 6.4 oz), SpO2 100 %.  PHYSICAL EXAMINATION:  GENERAL:  75 y.o.-year-old thin patient lying in the bed with no acute distress.  EYES: Pupils equal, round, reactive to light and accommodation. No scleral icterus. Extraocular muscles intact.  HEENT: Head atraumatic, normocephalic. Oropharynx and nasopharynx clear. Overall mucosa appears dry. NECK:  Supple, no jugular venous distention. No thyroid enlargement, no tenderness.  LUNGS: Normal breath sounds bilaterally, no wheezing, LL crepitation. No use of  accessory muscles of respiration. He can not finish full sentense in one breath. CARDIOVASCULAR: S1, S2 normal. No murmurs, rubs, or gallops.  ABDOMEN: Soft, nontender, nondistended. Bowel sounds present. No organomegaly or mass. Left inguinal hernea present. Reducible, non tender. EXTREMITIES: No pedal edema, cyanosis, or clubbing.  NEUROLOGIC: Cranial nerves II through XII are intact. Muscle strength 5/5 in all extremities. Sensation intact. Gait not checked.  PSYCHIATRIC: The patient is alert and oriented x 3.  SKIN: No obvious rash, lesion, or ulcer.   Physical Exam LABORATORY PANEL:   CBC  Recent Labs Lab 02/12/15 0401  WBC 7.9  HGB 12.2*  HCT 37.8*  PLT 360   ------------------------------------------------------------------------------------------------------------------  Chemistries   Recent Labs Lab 02/10/15 0838  02/13/15 0407  NA 140  < > 135  K 3.8  < > 3.8  CL 106  < > 100*  CO2 24  < > 30  GLUCOSE 133*  < > 151*  BUN 18  < > 23*  CREATININE 1.02  < > 0.80  CALCIUM 9.0  < > 8.8*  MG  --   < > 1.8  AST 19  --   --   ALT 14*  --   --   ALKPHOS 69  --   --   BILITOT 1.3*  --   --   < > = values in this interval not displayed. ------------------------------------------------------------------------------------------------------------------  Cardiac Enzymes  Recent Labs Lab 02/10/15 1939 02/11/15 0137  TROPONINI 0.03 <0.03   ------------------------------------------------------------------------------------------------------------------  RADIOLOGY:  No results found.  ASSESSMENT AND PLAN:   Active Problems:  CHF (congestive heart failure) (HCC)   Loss of weight   Abnormal CT scan, gastrointestinal tract   Protein-calorie malnutrition, severe   Acute systolic congestive heart failure (HCC)   Pleural effusion   Depression   Coronary artery disease involving coronary bypass graft of native heart with unstable angina pectoris (HCC)   SOB  (shortness of breath)  #1 acute systolic CHF   on IV Lasix, monitor on telemetry, check daily weights, fluid restriction up to 1-1/2 L a day.    Reviewed Echo- EF is 25%- he had CABG in [place].    BP is on lower side and mucosa is dry- he has very low EF, hold lasix.    Cardiology is planning to do cath today.    Further plan after that.     He may need inotrop or AICD.  #2 fatigue, shortness of breath and weight loss concerning for malignancy, CT chest negative for PE but possible  nodules in the lungs. Repeat chest CT scan in 3 months   CT abdomen showed proctitis or wall thickening, patient has no abdominal pain or diarrhea.    Appreciated GI consult, He may do procedure as out pt.after cardiac issues are stable.  #3 hypertension: hels meds as  running on lower side.    With hypotension, not a candidate for any betablocker or ACE at this time.   Hold lasix.  #4 diabetes mellitus type 2: Continue home medications. History of coronary artery disease now with congestive heart failure: Check cardiac markers, echocardiogram. #5 hypokalemia   As a result of Lasix.    Replace and monitor.  All the records are reviewed and case discussed with Care Management/Social Workerr. Management plans discussed with the patient, family and they are in agreement.  CODE STATUS: FUll  TOTAL TIME TAKING CARE OF THIS PATIENT: 35 minutes.    POSSIBLE D/C IN 2-3 DAYS, DEPENDING ON CLINICAL CONDITION.   Altamese Dilling M.D on 02/13/2015   Between 7am to 6pm - Pager - [phone]  After 6pm go to [email or web address] - password [email or web address] [facility]  [facility]  Office  [phone]  CC: Primary care physician; No PCP Per Patient  Note: This dictation was prepared with Dragon dictation along with smaller phrase technology. Any transcriptional errors that result from this process are unintentional.

## 2015-02-13 NOTE — Progress Notes (Signed)
Patient's family concerned about patient, stating he's more agitated and anxious than usual. Pain medication has not helped at all and patient has poor safety awareness. Spoke with Dr. Clint Guy and orders for haldol received. Nursing staff will continue to monitor. Lamonte Richer, RN

## 2015-02-13 NOTE — Progress Notes (Signed)
Cardiac catheterization Procedure this morning with details as below  Patent LAD with 50-60% proximal disease Diffusely disease ramus branch,  Essentially occluded left circumflex RCA is occluded at the ostium  Occluded graft to the RCA Occluded graft to the diagonal Patent graft to the OM with 95% stenosis in the body of the graft, 50% disease at the anastomosis Patent LIMA, small, atretic There is collaterals from left to the right (RCA territory)  Right heart catheterization showing very elevated right heart pressures, elevated wedge pressure  Case discussed with Dr. Kirke Corin,  Stent placed to the vein graft to the OM   Details of the cardiac catheterization And procedure were discussed with the family  Will need gentle diuresis given his elevated pressures If blood pressure runs low with poor urine output, may need dopamine and dobutamine infusion in the ICU

## 2015-02-13 NOTE — Progress Notes (Signed)
[facility] - [place] at [facility]   PATIENT NAME: Manuel Anthony    MR#:  [id number]  DATE OF BIRTH:  02-22-1940  SUBJECTIVE:  CHIEF COMPLAINT:   Chief Complaint  Patient presents with  . Shortness of Breath  . Weakness     Had worsening orthopnea, lose of weight.    Lasix held as BP was in lower side, continues to stay SOB on minimal exertion.  REVIEW OF SYSTEMS:  CONSTITUTIONAL: No fever, positive for fatigue or weakness.  EYES: No blurred or double vision.  EARS, NOSE, AND THROAT: No tinnitus or ear pain.  RESPIRATORY: No cough, positive for shortness of breath, no wheezing or hemoptysis.  CARDIOVASCULAR: No chest pain, positive for orthopnea, minimal edema.  GASTROINTESTINAL: No nausea, vomiting, diarrhea or abdominal pain.  GENITOURINARY: No dysuria, hematuria.  ENDOCRINE: No polyuria, nocturia,  HEMATOLOGY: No anemia, easy bruising or bleeding SKIN: No rash or lesion. MUSCULOSKELETAL: No joint pain or arthritis.   NEUROLOGIC: No tingling, numbness, weakness.  PSYCHIATRY: No anxiety or depression.   ROS  DRUG ALLERGIES:   Allergies  Allergen Reactions  . Penicillins Other (See Comments)    "passes out"    VITALS:  Blood pressure 78/54, pulse 101, temperature 98.3 F (36.8 C), temperature source Oral, resp. rate 16, height  (1.676 m), weight 52.345 kg (115 lb 6.4 oz), SpO2 98 %.  PHYSICAL EXAMINATION:  GENERAL:  75 y.o.-year-old thin patient lying in the bed with no acute distress.  EYES: Pupils equal, round, reactive to light and accommodation. No scleral icterus. Extraocular muscles intact.  HEENT: Head atraumatic, normocephalic. Oropharynx and nasopharynx clear. Overall mucosa appears dry. NECK:  Supple, no jugular venous distention. No thyroid enlargement, no tenderness.  LUNGS: Normal breath sounds bilaterally, no wheezing, LL crepitation. No use of accessory muscles of respiration. He can not finish full sentense in one  breath. CARDIOVASCULAR: S1, S2 normal. No murmurs, rubs, or gallops.  ABDOMEN: Soft, nontender, nondistended. Bowel sounds present. No organomegaly or mass. Left inguinal hernea present. Reducible, non tender. EXTREMITIES: No pedal edema, cyanosis, or clubbing.  NEUROLOGIC: Cranial nerves II through XII are intact. Muscle strength 5/5 in all extremities. Sensation intact. Gait not checked.  PSYCHIATRIC: The patient is alert and oriented x 3.  SKIN: No obvious rash, lesion, or ulcer.   Physical Exam LABORATORY PANEL:   CBC  Recent Labs Lab 02/12/15 0401  WBC 7.9  HGB 12.2*  HCT 37.8*  PLT 360   ------------------------------------------------------------------------------------------------------------------  Chemistries   Recent Labs Lab 02/10/15 0838  02/13/15 0407  NA 140  < > 135  K 3.8  < > 3.8  CL 106  < > 100*  CO2 24  < > 30  GLUCOSE 133*  < > 151*  BUN 18  < > 23*  CREATININE 1.02  < > 0.80  CALCIUM 9.0  < > 8.8*  MG  --   < > 1.8  AST 19  --   --   ALT 14*  --   --   ALKPHOS 69  --   --   BILITOT 1.3*  --   --   < > = values in this interval not displayed. ------------------------------------------------------------------------------------------------------------------  Cardiac Enzymes  Recent Labs Lab 02/10/15 1939 02/11/15 0137  TROPONINI 0.03 <0.03   ------------------------------------------------------------------------------------------------------------------  RADIOLOGY:  No results found.  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    Reviewed Echo- EF is 25%- he had CABG in [place].   BP is on lower side and mucosa is dry- he has very low EF, hold lasix.    Cardiology to help further.  #2 fatigue, shortness of breath and weight loss concerning for malignancy, CT chest negative for PE but possible  nodules in the lungs. Repeat chest CAT scan in 3 months   CT abdomen showed proctitis patient has no abdominal pain or diarrhea.    Appreciated GI consult, He may do procedure as out pt.as pt is having SOB now.   #3 hypertension: Controlled- actually running on lower side. #4 diabetes mellitus type 2: Continue home medications. History of coronary artery disease now with congestive heart failure: Check cardiac markers, echocardiogram. #5 hypokalemia   As a result of Lasix.    Replace and monitor.  All the records are reviewed and case discussed with Care Management/Social Workerr. Management plans discussed with the patient, family and they are in agreement.  CODE STATUS: FUll  TOTAL TIME TAKING CARE OF THIS PATIENT: 35 minutes.    POSSIBLE D/C IN 2-3 DAYS, DEPENDING ON CLINICAL CONDITION.   Altamese Dilling M.D on 02/13/2015   Between 7am to 6pm - Pager - [phone]  After 6pm go to [email or web address] - password [email or web address] [facility]  [place] [facility]  Office  [phone]  CC: Primary care physician; No PCP Per Patient  Note: This dictation was prepared with Dragon dictation along with smaller phrase technology. Any transcriptional errors that result from this process are unintentional.

## 2015-02-14 LAB — CBC
HCT: 36 % — ABNORMAL LOW (ref 40.0–52.0)
HEMOGLOBIN: 11.4 g/dL — AB (ref 13.0–18.0)
MCH: 25.1 pg — ABNORMAL LOW (ref 26.0–34.0)
MCHC: 31.8 g/dL — AB (ref 32.0–36.0)
MCV: 79.1 fL — ABNORMAL LOW (ref 80.0–100.0)
Platelets: 330 10*3/uL (ref 150–440)
RBC: 4.55 MIL/uL (ref 4.40–5.90)
RDW: 17.9 % — AB (ref 11.5–14.5)
WBC: 8 10*3/uL (ref 3.8–10.6)

## 2015-02-14 LAB — GLUCOSE, CAPILLARY
GLUCOSE-CAPILLARY: 188 mg/dL — AB (ref 65–99)
GLUCOSE-CAPILLARY: 88 mg/dL (ref 65–99)
Glucose-Capillary: 175 mg/dL — ABNORMAL HIGH (ref 65–99)
Glucose-Capillary: 96 mg/dL (ref 65–99)

## 2015-02-14 LAB — BASIC METABOLIC PANEL
ANION GAP: 6 (ref 5–15)
BUN: 20 mg/dL (ref 6–20)
CALCIUM: 8.7 mg/dL — AB (ref 8.9–10.3)
CO2: 26 mmol/L (ref 22–32)
Chloride: 97 mmol/L — ABNORMAL LOW (ref 101–111)
Creatinine, Ser: 0.9 mg/dL (ref 0.61–1.24)
GFR calc Af Amer: >60 mL/min (ref >60)
GLUCOSE: 171 mg/dL — AB (ref 65–99)
Potassium: 4.8 mmol/L (ref 3.5–5.1)
Sodium: 129 mmol/L — ABNORMAL LOW (ref 135–145)

## 2015-02-14 LAB — MRSA PCR SCREENING: MRSA by PCR: NEGATIVE

## 2015-02-14 MED ORDER — LORAZEPAM 2 MG/ML IJ SOLN
0.5000 mg | Freq: Four times a day (QID) | INTRAMUSCULAR | Status: DC | PRN
  Administered 2015-02-14 – 2015-02-16 (×2): 0.5 mg via INTRAVENOUS
  Filled 2015-02-14 (×2): qty 1

## 2015-02-14 MED ORDER — MORPHINE SULFATE (PF) 2 MG/ML IV SOLN
2.0000 mg | INTRAVENOUS | Status: DC | PRN
  Administered 2015-02-14 – 2015-02-15 (×4): 4 mg via INTRAVENOUS
  Administered 2015-02-15: 2 mg via INTRAVENOUS
  Filled 2015-02-14: qty 2
  Filled 2015-02-14: qty 1
  Filled 2015-02-14 (×3): qty 2

## 2015-02-14 MED ORDER — MILRINONE IN DEXTROSE 20 MG/100ML IV SOLN
0.2500 ug/kg/min | INTRAVENOUS | Status: DC
  Administered 2015-02-14 – 2015-02-15 (×2): 0.375 ug/kg/min via INTRAVENOUS
  Filled 2015-02-14 (×3): qty 100

## 2015-02-14 MED ORDER — DOBUTAMINE IN D5W 4-5 MG/ML-% IV SOLN
5.0000 ug/kg/min | INTRAVENOUS | Status: DC
  Administered 2015-02-14: 5 ug/kg/min via INTRAVENOUS
  Filled 2015-02-14: qty 250

## 2015-02-14 NOTE — Progress Notes (Signed)
[facility] - [place] at [facility]   PATIENT NAME: Manuel Anthony    MR#:  [id number]  DATE OF BIRTH:  05/17/1940  SUBJECTIVE:  CHIEF COMPLAINT:   Chief Complaint  Patient presents with  . Shortness of Breath  . Weakness     Admitted for SOB, weakness. Has baseline dementia.  Cardiac cath 1/26 with DES in SVG to OM.  Still has SOB today. On and off hypotension limiting use of lasix. Poor response over last 24 hrs.  REVIEW OF SYSTEMS:  CONSTITUTIONAL: No fever, positive for fatigue or weakness.  EYES: No blurred or double vision.  EARS, NOSE, AND THROAT: No tinnitus or ear pain.  RESPIRATORY: SOB CARDIOVASCULAR: Orthopnea GASTROINTESTINAL: No nausea, vomiting, diarrhea or abdominal pain.  GENITOURINARY: No dysuria, hematuria.  ENDOCRINE: No polyuria, nocturia,  HEMATOLOGY: No anemia, easy bruising or bleeding SKIN: No rash or lesion. MUSCULOSKELETAL: No joint pain or arthritis.   NEUROLOGIC: No tingling, numbness, weakness.  PSYCHIATRY: No anxiety or depression.   ROS  DRUG ALLERGIES:   Allergies  Allergen Reactions  . Penicillins Other (See Comments)    "passes out"    VITALS:  Blood pressure 105/71, pulse 117, temperature 97.5 F (36.4 C), temperature source Oral, resp. rate 22, height  (1.676 m), weight 54.114 kg (119 lb 4.8 oz), SpO2 94 %.  PHYSICAL EXAMINATION:  GENERAL:  75 y.o.-year-old thin patient lying in the bed with no acute distress. [place]. Thin. EYES: Pupils equal, round, reactive to light and accommodation. No scleral icterus. Extraocular muscles intact.  HEENT: Head atraumatic, normocephalic. Oropharynx and nasopharynx clear. Overall mucosa appears dry. NECK:  Supple, no jugular venous distention. No thyroid enlargement, no tenderness.  LUNGS: Normal breath sounds bilaterally, no wheezing, LL crepitation. No use of accessory muscles of respiration. He can not finish full sentense in one breath. CARDIOVASCULAR: S1,  S2 normal. No murmurs, rubs, or gallops.  ABDOMEN: Soft, nontender, nondistended. Bowel sounds present. No organomegaly or mass. Left inguinal hernea present. Reducible, non tender. EXTREMITIES: No pedal edema, cyanosis, or clubbing.  NEUROLOGIC: Cranial nerves II through XII are intact. Muscle strength 5/5 in all extremities. Sensation intact. Gait not checked.  PSYCHIATRIC: The patient is alert and oriented x 3.  SKIN: No obvious rash, lesion, or ulcer.   Physical Exam LABORATORY PANEL:   CBC  Recent Labs Lab 02/14/15 0540  WBC 8.0  HGB 11.4*  HCT 36.0*  PLT 330   ------------------------------------------------------------------------------------------------------------------  Chemistries   Recent Labs Lab 02/10/15 0838  02/13/15 0407  02/14/15 0540  NA 140  < > 135  --  129*  K 3.8  < > 3.8  --  4.8  CL 106  < > 100*  --  97*  CO2 24  < > 30  --  26  GLUCOSE 133*  < > 151*  --  171*  BUN 18  < > 23*  --  20  CREATININE 1.02  < > 0.80  < > 0.90  CALCIUM 9.0  < > 8.8*  --  8.7*  MG  --   < > 1.8  --   --   AST 19  --   --   --   --   ALT 14*  --   --   --   --   ALKPHOS 69  --   --   --   --   BILITOT 1.3*  --   --   --   --   < > =  values in this interval not displayed. ------------------------------------------------------------------------------------------------------------------  Cardiac Enzymes  Recent Labs Lab 02/10/15 1939 02/11/15 0137  TROPONINI 0.03 <0.03   ------------------------------------------------------------------------------------------------------------------  RADIOLOGY:  No results found.  ASSESSMENT AND PLAN:   Active Problems:   CHF (congestive heart failure) (HCC)   Loss of weight   Abnormal CT scan, gastrointestinal tract   Protein-calorie malnutrition, severe   Acute systolic congestive heart failure (HCC)   Pleural effusion   Depression   Coronary artery disease involving coronary bypass graft of native heart with  unstable angina pectoris (HCC)   SOB (shortness of breath)   Unstable angina (HCC)  # Acute on chronic systolic CHF   On IV Lasix, monitor on telemetry, check daily weights, fluid restriction up to 1-1/2 L a day.  Echo- EF is 25%- he had CABG in [place].    Transfer to ICU for ionotrope support. An continued diuresis with lasix Overall poor prognosis. Hish risk for cardiac arrest and death.  # CAD Drug-eluting stent placement to SVG to OM - 02/13/2015  # Ffatigue, shortness of breath and weight loss concerning for malignancy, CT chest negative for PE but possible  nodules in the lungs. Repeat chest CT scan in 3 months   CT abdomen showed proctitis or wall thickening, patient has no abdominal pain or diarrhea.    Appreciated GI consult, He may do procedure as out pt.after cardiac issues are stable.  # hypertension: held meds due to hypotension.    With hypotension, not a candidate for any betablocker or ACE at this time.  # Diabetes mellitus type 2: Continue home medications.  # Hypokalemia   As a result of Lasix.    Replace and monitor.  All the records are reviewed and case discussed with Care Management/Social Workerr. Management plans discussed with the patient, family and they are in agreement.  CODE STATUS: FUll  TOTAL CC TIME TAKING CARE OF THIS PATIENT: 35 minutes.    POSSIBLE D/C IN 3-4 DAYS, DEPENDING ON CLINICAL CONDITION.   Milagros Loll R M.D on 02/14/2015   Between 7am to 6pm - Pager - [phone]  After 6pm go to [email or web address] - password [email or web address] [facility]  [place] [facility]  Office  [phone]  CC: Primary care physician; No PCP Per Patient  Note: This dictation was prepared with Dragon dictation along with smaller phrase technology. Any transcriptional errors that result from this process are unintentional.

## 2015-02-14 NOTE — Progress Notes (Addendum)
SUBJECTIVE:  The patient had a right and left cardiac catheterization yesterday which showed severely reduced LV systolic function with mildly reduced cardiac output and significantly elevated filling pressures. Mean wedge pressure was 25 mm. Coronary angiography showed occluded SVG to diagonal, occluded SVG to RCA and significant disease in SVG to OM. He underwent successful drug-eluting stent placement to SVG to OM. The RCA was chronically occluded with left-to-right collaterals. The LAD had moderate diffuse disease. The patient has been agitated throughout the night with significant dyspnea, poor urine output and episodes of apnea.   Filed Vitals:   02/13/15 1404 02/13/15 2024 02/14/15 0500 02/14/15 0511  BP: 85/60 88/68  105/71  Pulse: 97 121  117  Temp: 97.9 F (36.6 C) 97.8 F (36.6 C)  97.5 F (36.4 C)  TempSrc: Oral Oral  Oral  Resp: 20   22  Height:      Weight:   119 lb 4.8 oz (54.114 kg)   SpO2: 100% 98%  94%    Intake/Output Summary (Last 24 hours) at 02/14/15 0900 Last data filed at 02/14/15 0511  Gross per 24 hour  Intake      0 ml  Output    300 ml  Net   -300 ml    LABS: Basic Metabolic Panel:  Recent Labs  78/29/56 0401 02/13/15 0407 02/13/15 1424 02/14/15 0540  NA 132* 135  --  129*  K 3.1* 3.8  --  4.8  CL 94* 100*  --  97*  CO2 29 30  --  26  GLUCOSE 116* 151*  --  171*  BUN 15 23*  --  20  CREATININE 0.95 0.80 0.84 0.90  CALCIUM 8.3* 8.8*  --  8.7*  MG 1.4* 1.8  --   --    Liver Function Tests: No results for input(s): AST, ALT, ALKPHOS, BILITOT, PROT, ALBUMIN in the last 72 hours. No results for input(s): LIPASE, AMYLASE in the last 72 hours. CBC:  Recent Labs  02/13/15 1424 02/14/15 0540  WBC 6.0 8.0  HGB 12.2* 11.4*  HCT 38.2* 36.0*  MCV 79.2* 79.1*  PLT 370 330   Cardiac Enzymes: No results for input(s): CKTOTAL, CKMB, CKMBINDEX, TROPONINI in the last 72 hours. BNP: Invalid input(s): POCBNP D-Dimer: No results for  input(s): DDIMER in the last 72 hours. Hemoglobin A1C: No results for input(s): HGBA1C in the last 72 hours. Fasting Lipid Panel:  Recent Labs  02/13/15 0407  CHOL 84  HDL 28*  LDLCALC 47  TRIG 47  CHOLHDL 3.0   Thyroid Function Tests: No results for input(s): TSH, T4TOTAL, T3FREE, THYROIDAB in the last 72 hours.  Invalid input(s): FREET3 Anemia Panel: No results for input(s): VITAMINB12, FOLATE, FERRITIN, TIBC, IRON, RETICCTPCT in the last 72 hours.   PHYSICAL EXAM General:   the patient is cachectic and in mild respiratory distress. HEENT:   Normocephalic and atramatic Neck:  mild JVD.  Lungs: positive bibasilar crackles.  Heart: HRRR . Normal S1 and S2 without gallops or murmurs.  Abdomen: Bowel sounds are positive, abdomen soft and non-tender  Msk:  Back normal, normal gait. Normal strength and tone for age. Extremities: No clubbing, cyanosis or edema.   Neuro: he is drwosy.  Psych:  Good affect, responds appropriately  TELEMETRY: Reviewed telemetry pt in sinus tachycardia  ASSESSMENT AND PLAN:  1. Acute on Chronic Systolic CHF  - Echo performed on 02/11/2015 which showed an EF of 20-25% with diffuse hypokinesis.   Cardiac catheterization was done  yesterday and results are listed. The patient is significantly volume overloaded has not had any good urine output with IV diuresis. This is likely due to low cardiac output and decreased renal perfusion. I think the best option is to try an inotrope short-term and continue IV diuresis.  The patient is cachectic and overall prognosis is poor. I discussed this with the patient and his sister-in-law. He wants to be full code for now.I am going to transfer him to the ICU. Not able to start a beta blocker or an ACE inhibitor at the present time due to hypotension.  2. History of CAD - Status post drug-eluting stent placement to SVG to OM yesterday. Continue dual antiplatelet therapy and treatment with a statin.  3.  Hypotension - likely due to  low cardiac output.  4. HLD  - continue statin therapy  5. Type 2 DM -  continue to hold metformin due to systolic heart failure.   6. Early-Stage Dementia - his mental status has worsened since hospitalization likely due to an element of delirium.   7. Weight Loss/ Wall thickening of Rectum on CT -  I wonder if her underlying malignancy.   8. Bilateral upper lobe nodules - noted on recent CT - follow-up CT in 3 months recommended.   Addendum, Dobutamine was associated with significant sinus tachycardia. Thus, I switched to Milrinone.   Lorine Bears, MD, [facility] 02/14/2015 9:00 AM

## 2015-02-14 NOTE — Care Management (Signed)
The patient had a right and left cardiac catheterization yesterday which showed severely reduced LV systolic function with mildly reduced cardiac output and significantly elevated filling pressures. He underwent successful drug-eluting stent placement to SVG to OM. The RCA was chronically occluded with left-to-right collaterals.  Patient has been very agitated over the last 24 hours with respiratory difficulty.  He is being transferred to icu for dobutamine therapy.  There is concern for overall poor prognosis.  patient is full code.  may benefit from palliative care consult for goals of treatment

## 2015-02-14 NOTE — Consult Note (Signed)
[facility] Consultation      Name: Manuel Anthony MRN: [id number] DOB: 1940-04-04    ADMISSION DATE:  02/10/2015    CHIEF COMPLAINT:     Acute shock and resp distress   HISTORY OF PRESENT ILLNESS  75 yo male with acute SOB adn shock, admitted to ICU s/p cath, cardiogenic shock, patient unable to provide ROS Patient cachectic, and somnelent  Patient admitted for worsening SOB   PAST MEDICAL HISTORY    :  Past Medical History  Diagnosis Date  . Diabetes mellitus without complication (HCC)   . CHF (congestive heart failure) (HCC)   . CAD (coronary artery disease)     a. CABG in 2003 following cath which showed 99% stenosis RCA, 99% LAD and 90% LCx  . Chronic systolic CHF (congestive heart failure) (HCC)     a. 02/11/2012: EF 20-25% by echo with diffuse hypokinesis  . HLD (hyperlipidemia)    Past Surgical History  Procedure Laterality Date  . Cardiac surgery    . Hernia repair     Prior to Admission medications   Medication Sig Start Date End Date Taking? Authorizing Provider  aspirin EC 81 MG tablet Take 81 mg by mouth daily.   Yes Historical Provider, MD  Cholecalciferol 1000 units tablet Take 1,000 Units by mouth daily.   Yes Historical Provider, MD  donepezil (ARICEPT) 10 MG tablet Take 10 mg by mouth at bedtime.   Yes Historical Provider, MD  glipiZIDE (GLUCOTROL) 10 MG tablet Take 10 mg by mouth daily before breakfast.   Yes Historical Provider, MD  metFORMIN (GLUCOPHAGE) 1000 MG tablet Take 1,000 mg by mouth 2 (two) times daily with a meal.   Yes Historical Provider, MD  pravastatin (PRAVACHOL) 80 MG tablet Take 80 mg by mouth daily.   Yes Historical Provider, MD   Allergies  Allergen Reactions  . Penicillins Other (See Comments)    "passes out"     FAMILY HISTORY   Family History  Problem Relation Age of Onset  . Coronary artery disease Father   . Hypertension Father   . Heart attack Paternal Grandfather       SOCIAL  HISTORY    reports that he quit smoking about 20 years ago. His smoking use included Cigarettes. He has a 20 pack-year smoking history. He does not have any smokeless tobacco history on file. He reports that he does not drink alcohol or use illicit drugs.  Review of Systems  Unable to perform ROS: critical illness      VITAL SIGNS    Temp:  [97.5 F (36.4 C)-98.1 F (36.7 C)] 98.1 F (36.7 C) (01/27 1100) Pulse Rate:  [108-135] 108 (01/27 1300) Resp:  [15-22] 15 (01/27 1300) BP: (88-120)/(68-81) 109/76 mmHg (01/27 1300) SpO2:  [94 %-100 %] 94 % (01/27 1300) Weight:  [119 lb 4.8 oz (54.114 kg)-119 lb 11.4 oz (54.3 kg)] 119 lb 11.4 oz (54.3 kg) (01/27 1045) HEMODYNAMICS:   VENTILATOR SETTINGS:   INTAKE / OUTPUT:  Intake/Output Summary (Last 24 hours) at 02/14/15 1534 Last data filed at 02/14/15 1200  Gross per 24 hour  Intake      0 ml  Output    525 ml  Net   -525 ml       PHYSICAL EXAM   Physical Exam  Constitutional: He appears distressed.  Frail, very thin, cachectic Temporal waisting   HENT:  Head: Normocephalic and atraumatic.  Eyes: Pupils are equal, round, and reactive to light.  Neck: Normal range of motion. Neck supple.  Cardiovascular: Normal rate and regular rhythm.   No murmur heard. Pulmonary/Chest: He is in respiratory distress. He has wheezes. He has rales.  resp distress  Abdominal: Soft. Bowel sounds are normal.  Musculoskeletal: He exhibits no edema.  Neurological: He displays normal reflexes. Coordination normal.  gcs<8T  Skin: Skin is warm. No rash noted. He is diaphoretic.       LABS   LABS:  CBC  Recent Labs Lab 02/12/15 0401 02/13/15 1424 02/14/15 0540  WBC 7.9 6.0 8.0  HGB 12.2* 12.2* 11.4*  HCT 37.8* 38.2* 36.0*  PLT 360 370 330   Coag's No results for input(s): APTT, INR in the last 168 hours. BMET  Recent Labs Lab 02/12/15 0401 02/13/15 0407 02/13/15 1424 02/14/15 0540  NA 132* 135  --  129*  K 3.1*  3.8  --  4.8  CL 94* 100*  --  97*  CO2 29 30  --  26  BUN 15 23*  --  20  CREATININE 0.95 0.80 0.84 0.90  GLUCOSE 116* 151*  --  171*   Electrolytes  Recent Labs Lab 02/12/15 0401 02/13/15 0407 02/14/15 0540  CALCIUM 8.3* 8.8* 8.7*  MG 1.4* 1.8  --    Sepsis Markers No results for input(s): LATICACIDVEN, PROCALCITON, O2SATVEN in the last 168 hours. ABG No results for input(s): PHART, PCO2ART, PO2ART in the last 168 hours. Liver Enzymes  Recent Labs Lab 02/10/15 0838  AST 19  ALT 14*  ALKPHOS 69  BILITOT 1.3*  ALBUMIN 3.4*   Cardiac Enzymes  Recent Labs Lab 02/10/15 1346 02/10/15 1939 02/11/15 0137  TROPONINI 0.03 0.03 <0.03   Glucose  Recent Labs Lab 02/13/15 0729 02/13/15 1401 02/13/15 1631 02/13/15 2135 02/14/15 0806 02/14/15 1136  GLUCAP 106* 141* 222* 96 188* 175*     Recent Results (from the past 240 hour(s))  MRSA PCR Screening     Status: None   Collection Time: 02/14/15 10:53 AM  Result Value Ref Range Status   MRSA by PCR NEGATIVE NEGATIVE Final    Comment:        The GeneXpert MRSA Assay (FDA approved for NASAL specimens only), is one component of a comprehensive MRSA colonization surveillance program. It is not intended to diagnose MRSA infection nor to guide or monitor treatment for MRSA infections.      Current facility-administered medications:  .  0.9 %  sodium chloride infusion, , Intravenous, Continuous, Antonieta Iba, MD, Last Rate: 10 mL/hr at 02/14/15 0012 .  0.9 %  sodium chloride infusion, 250 mL, Intravenous, PRN, Iran Ouch, MD .  acetaminophen (TYLENOL) tablet 650 mg, 650 mg, Oral, Q6H PRN **OR** acetaminophen (TYLENOL) suppository 650 mg, 650 mg, Rectal, Q6H PRN, Katha Hamming, MD .  aspirin EC tablet 81 mg, 81 mg, Oral, Daily, Katha Hamming, MD, 81 mg at 02/14/15 0906 .  cholecalciferol (VITAMIN D) tablet 1,000 Units, 1,000 Units, Oral, Daily, Katha Hamming, MD, 1,000 Units at  02/14/15 0906 .  clopidogrel (PLAVIX) tablet 75 mg, 75 mg, Oral, Q breakfast, Iran Ouch, MD, 75 mg at 02/14/15 0906 .  DOBUTamine (DOBUTREX) infusion 4000 mcg/mL, 5 mcg/kg/min, Intravenous, Titrated, Iran Ouch, MD, Last Rate: 4.1 mL/hr at 02/14/15 1105, 5 mcg/kg/min at 02/14/15 1105 .  donepezil (ARICEPT) tablet 10 mg, 10 mg, Oral, QHS, Katha Hamming, MD, 10 mg at 02/13/15 2125 .  enoxaparin (LOVENOX) injection 40 mg, 40 mg, Subcutaneous, Q24H, Iran Ouch, MD,  40 mg at 02/14/15 0909 .  feeding supplement (ENSURE ENLIVE) (ENSURE ENLIVE) liquid 237 mL, 237 mL, Oral, TID BM, Altamese Dilling, MD, 237 mL at 02/14/15 1248 .  furosemide (LASIX) injection 40 mg, 40 mg, Intravenous, BID, Enedina Finner, MD, 40 mg at 02/14/15 1107 .  glipiZIDE (GLUCOTROL) tablet 10 mg, 10 mg, Oral, QAC breakfast, Katha Hamming, MD, 10 mg at 02/14/15 0906 .  HYDROcodone-acetaminophen (NORCO/VICODIN) 5-325 MG per tablet 1-2 tablet, 1-2 tablet, Oral, Q4H PRN, Katha Hamming, MD, 2 tablet at 02/13/15 2125 .  Influenza vac split quadrivalent PF (FLUARIX) injection 0.5 mL, 0.5 mL, Intramuscular, Tomorrow-1000, Katha Hamming, MD, 0.5 mL at 02/11/15 1119 .  insulin aspart (novoLOG) injection 0-9 Units, 0-9 Units, Subcutaneous, TID WC, Altamese Dilling, MD, 2 Units at 02/14/15 1244 .  LORazepam (ATIVAN) injection 0.5 mg, 0.5 mg, Intravenous, Q6H PRN, Milagros Loll, MD, 0.5 mg at 02/14/15 1245 .  milrinone (PRIMACOR) 20 MG/100ML (0.2 mg/mL) infusion, 0.375 mcg/kg/min, Intravenous, Continuous, Iran Ouch, MD, Last Rate: 6.1 mL/hr at 02/14/15 1514, 0.375 mcg/kg/min at 02/14/15 1514 .  ondansetron (ZOFRAN) tablet 4 mg, 4 mg, Oral, Q6H PRN **OR** ondansetron (ZOFRAN) injection 4 mg, 4 mg, Intravenous, Q6H PRN, Katha Hamming, MD .  pneumococcal 23 valent vaccine (PNU-IMMUNE) injection 0.5 mL, 0.5 mL, Intramuscular, Tomorrow-1000, Katha Hamming, MD, 0.5 mL at 02/11/15 1119 .   pravastatin (PRAVACHOL) tablet 80 mg, 80 mg, Oral, Daily, Katha Hamming, MD, 80 mg at 02/14/15 0906 .  sodium chloride flush (NS) 0.9 % injection 3 mL, 3 mL, Intravenous, Q12H, Iran Ouch, MD, 3 mL at 02/14/15 1000 .  sodium chloride flush (NS) 0.9 % injection 3 mL, 3 mL, Intravenous, PRN, Iran Ouch, MD  MAJOR EVENTS/TEST RESULTS: ECHO EF 20%   ASSESSMENT/PLAN   75 yo male admitted to ICU for cardiogenic shock and resp failure in the setting severe malnutrition and dementia With resp distress  PULMONARY Oxygen as needed -high risk for intubation  CARDIOVASCULAR Shock-cardiogenic -milrenone as per cardiology -prognosis is very poor  RENAL Foley catheter,follow UO  GASTROINTESTINAL keep NPO for now    NEUROLOGIC encephalopthy from shock   The Patient requires high complexity decision making for assessment and support, frequent evaluation and titration of therapies, application of advanced monitoring technologies and extensive interpretation of multiple databases. Critical Care Time devoted to patient care services described in this note is 35 minutes.   Overall, patient is critically ill, prognosis is guarded. Patient at high risk for cardiac arrest and death.  Recommend DNR/DNI status-will discuss with family  Lucie Leather, M.D.  [facility]  Medical Director [facility] Medical Director [facility] Cardio-Pulmonary Department

## 2015-02-14 NOTE — Progress Notes (Signed)
Nutrition Follow-up  DOCUMENTATION CODES:   Severe malnutrition in context of chronic illness  INTERVENTION:   Meals and Snacks: Cater to patient preferences; recommend liberalizing diet as tolerated. May benefit from holding diet until status improves Medical Food Supplement Therapy: continue Ensure Enlive po TID, each supplement provides 350 kcal and 20 grams of protein   NUTRITION DIAGNOSIS:   Malnutrition related to poor appetite, social / environmental circumstances, chronic illness as evidenced by severe depletion of body fat, severe depletion of muscle mass.  GOAL:   Patient will meet greater than or equal to 90% of their needs  MONITOR:    (Energy Intake, Anthropometrics, Electrolyte./Renal Profile, Digestive System)  REASON FOR ASSESSMENT:   Malnutrition Screening Tool, Diagnosis    ASSESSMENT:    Pt transferred to ICU for cardiogenic shock and respiratory failure in setting of severe malnutrition and dementia, high risk for intubation per MD notes. ECHO EF 20-25%, cardiac cath on 1/26 with stent placed.   Diet Order:  Diet heart healthy/carb modified Room service appropriate?: Yes; Fluid consistency:: Thin   Energy Intake: pt not safe for po intake at present due to mentation, took bites at breakfast this AM. Family reporting that yesterday pt with excellent appetite, could not stop eating. Ate entire meal tray post procedure then some ice cream, 3 cookies, 3 bags of peanuts and raisins, etc. Pt also taking some Ensure  Electrolyte and Renal Profile:  Recent Labs Lab 02/12/15 0401 02/13/15 0407 02/13/15 1424 02/14/15 0540  BUN 15 23*  --  20  CREATININE 0.95 0.80 0.84 0.90  NA 132* 135  --  129*  K 3.1* 3.8  --  4.8  MG 1.4* 1.8  --   --    Glucose Profile:  Recent Labs  02/13/15 2135 02/14/15 0806 02/14/15 1136  GLUCAP 96 188* 175*   Meds: lasix, ss novolog  Height:   Ht Readings from Last 1 Encounters:  02/14/15  (1.676 m)    Weight:    Wt Readings from Last 1 Encounters:  02/14/15 119 lb 11.4 oz (54.3 kg)    Filed Weights   02/13/15 0454 02/14/15 0500 02/14/15 1045  Weight: 115 lb 6.4 oz (52.345 kg) 119 lb 4.8 oz (54.114 kg) 119 lb 11.4 oz (54.3 kg)    BMI:  Body mass index is 19.33 kg/(m^2).  Estimated Nutritional Needs:   Kcal:  1610-9604 kcals (BEE 1206, 1.3 AF, 1.0-1.2 IF)   Protein:  57-68 g (!.1-1.3 g/kg)   Fluid:  1300-1560 mL (25-30 ml/kg)   HIGH Care Level  Romelle Starcher MS, RD, LDN [phone] Pager  [phone] Weekend/On-Call Pager

## 2015-02-14 NOTE — Consult Note (Signed)
I have discussed case with family regarding patients poor clinical condition and poor resp status, I have recommended DNR and DNI status and they stated that's what patient would want and the family has agreed to DNR/DNI status.  The son wanted to contact his other brother to confirm this decision. At this time, will wait for final instructions from family and await to place DNR/DNI orders  Will prescribe Morphine as needed

## 2015-02-14 NOTE — Progress Notes (Addendum)
Report called to Bernette Mayers and patient transferred to ICU.

## 2015-02-14 NOTE — Progress Notes (Signed)
PT Cancellation Note  Patient Details Name: Mekhai Venuto MRN: [id number] DOB: [birth year]   Cancelled Treatment:    Reason Eval/Treat Not Completed: Medical issues which prohibited therapy (Per chart review, patient noted with increased agitation, increased respiratory difficulty; pending transfer to CCU for dobutamine therapy.  Will re-attempt at later time/date as medically appropriate.)   Caius Silbernagel H. Manson Passey, PT, DPT, NCS 02/14/2015, 10:19 AM [phone]

## 2015-02-15 DIAGNOSIS — R570 Cardiogenic shock: Secondary | ICD-10-CM

## 2015-02-15 LAB — BASIC METABOLIC PANEL
Anion gap: 9 (ref 5–15)
BUN: 18 mg/dL (ref 6–20)
CHLORIDE: 100 mmol/L — AB (ref 101–111)
CO2: 28 mmol/L (ref 22–32)
CREATININE: 0.96 mg/dL (ref 0.61–1.24)
Calcium: 9 mg/dL (ref 8.9–10.3)
Glucose, Bld: 100 mg/dL — ABNORMAL HIGH (ref 65–99)
POTASSIUM: 3.7 mmol/L (ref 3.5–5.1)
SODIUM: 137 mmol/L (ref 135–145)

## 2015-02-15 LAB — GLUCOSE, CAPILLARY
GLUCOSE-CAPILLARY: 101 mg/dL — AB (ref 65–99)
GLUCOSE-CAPILLARY: 201 mg/dL — AB (ref 65–99)
Glucose-Capillary: 93 mg/dL (ref 65–99)
Glucose-Capillary: 113 mg/dL — ABNORMAL HIGH (ref 65–99)

## 2015-02-15 MED ORDER — SODIUM CHLORIDE 0.9 % IV BOLUS (SEPSIS)
200.0000 mL | Freq: Once | INTRAVENOUS | Status: AC
  Administered 2015-02-15: 200 mL via INTRAVENOUS

## 2015-02-15 MED ORDER — MORPHINE 100MG IN NS 100ML (1MG/ML) PREMIX INFUSION: 10.0000 mg/h | INTRAVENOUS | Status: DC

## 2015-02-15 MED ORDER — SODIUM CHLORIDE 0.9 % IV BOLUS (SEPSIS): 500.0000 mL | Freq: Once | INTRAVENOUS | Status: DC

## 2015-02-15 MED ORDER — MORPHINE BOLUS VIA INFUSION
5.0000 mg | INTRAVENOUS | Status: DC | PRN
  Filled 2015-02-15: qty 20

## 2015-02-15 NOTE — Progress Notes (Signed)
Patient family questioning "what to expect next" and would like to speak to the doctor about goals of care and potential end-of-life measures. MD notified by Elita Quick, RN.

## 2015-02-15 NOTE — Consult Note (Signed)
[facility] Pulmonary Medicine Consultation      Name: Manuel Anthony MRN: [id number] DOB: 1940-02-04    ADMISSION DATE:  02/10/2015    CHIEF COMPLAINT:     Remains in Acute shock and resp distress improved   HISTORY OF PRESENT ILLNESS  Remains confused, WOB improved, on milrenone infusion Awaiting cardiology input,   Review of Systems  Unable to perform ROS: critical illness      VITAL SIGNS    Temp:  [98.1 F (36.7 C)-98.7 F (37.1 C)] 98.7 F (37.1 C) (01/28 0745) Pulse Rate:  [66-135] 111 (01/28 0600) Resp:  [12-26] 17 (01/28 0600) BP: (75-120)/(45-87) 87/61 mmHg (01/28 0600) SpO2:  [92 %-100 %] 100 % (01/28 0600) Weight:  [119 lb 11.4 oz (54.3 kg)] 119 lb 11.4 oz (54.3 kg) (01/28 0406) HEMODYNAMICS:   VENTILATOR SETTINGS:   INTAKE / OUTPUT:  Intake/Output Summary (Last 24 hours) at 02/15/15 0837 Last data filed at 02/15/15 0533  Gross per 24 hour  Intake   99.3 ml  Output   1750 ml  Net -1650.7 ml       PHYSICAL EXAM   Physical Exam  Constitutional: No distress.  Frail, very thin, cachectic Temporal waisting   HENT:  Head: Normocephalic and atraumatic.  Eyes: Pupils are equal, round, and reactive to light.  Neck: Normal range of motion. Neck supple.  Cardiovascular: Normal rate and regular rhythm.   No murmur heard. Pulmonary/Chest: No respiratory distress. He has no wheezes. He has rales.  Abdominal: Soft. Bowel sounds are normal.  Musculoskeletal: He exhibits no edema.  Neurological: He displays normal reflexes. Coordination normal.  confused  Skin: Skin is warm. No rash noted. He is not diaphoretic.       LABS   LABS:  CBC  Recent Labs Lab 02/12/15 0401 02/13/15 1424 02/14/15 0540  WBC 7.9 6.0 8.0  HGB 12.2* 12.2* 11.4*  HCT 37.8* 38.2* 36.0*  PLT 360 370 330   Coag's No results for input(s): APTT, INR in the last 168 hours. BMET  Recent Labs Lab 02/13/15 0407 02/13/15 1424 02/14/15 0540 02/15/15 0423    NA 135  --  129* 137  K 3.8  --  4.8 3.7  CL 100*  --  97* 100*  CO2 30  --  26 28  BUN 23*  --  20 18  CREATININE 0.80 0.84 0.90 0.96  GLUCOSE 151*  --  171* 100*   Electrolytes  Recent Labs Lab 02/12/15 0401 02/13/15 0407 02/14/15 0540 02/15/15 0423  CALCIUM 8.3* 8.8* 8.7* 9.0  MG 1.4* 1.8  --   --    Sepsis Markers No results for input(s): LATICACIDVEN, PROCALCITON, O2SATVEN in the last 168 hours. ABG No results for input(s): PHART, PCO2ART, PO2ART in the last 168 hours. Liver Enzymes  Recent Labs Lab 02/10/15 0838  AST 19  ALT 14*  ALKPHOS 69  BILITOT 1.3*  ALBUMIN 3.4*   Cardiac Enzymes  Recent Labs Lab 02/10/15 1346 02/10/15 1939 02/11/15 0137  TROPONINI 0.03 0.03 <0.03   Glucose  Recent Labs Lab 02/13/15 2135 02/14/15 0806 02/14/15 1136 02/14/15 1707 02/14/15 2136 02/15/15 0805  GLUCAP 96 188* 175* 96 88 101*     Recent Results (from the past 240 hour(s))  MRSA PCR Screening     Status: None   Collection Time: 02/14/15 10:53 AM  Result Value Ref Range Status   MRSA by PCR NEGATIVE NEGATIVE Final    Comment:        The  GeneXpert MRSA Assay (FDA approved for NASAL specimens only), is one component of a comprehensive MRSA colonization surveillance program. It is not intended to diagnose MRSA infection nor to guide or monitor treatment for MRSA infections.      Current facility-administered medications:  .  0.9 %  sodium chloride infusion, , Intravenous, Continuous, Antonieta Iba, MD, Last Rate: 10 mL/hr at 02/14/15 0012 .  0.9 %  sodium chloride infusion, 250 mL, Intravenous, PRN, Iran Ouch, MD .  acetaminophen (TYLENOL) tablet 650 mg, 650 mg, Oral, Q6H PRN **OR** acetaminophen (TYLENOL) suppository 650 mg, 650 mg, Rectal, Q6H PRN, Katha Hamming, MD .  aspirin EC tablet 81 mg, 81 mg, Oral, Daily, Katha Hamming, MD, 81 mg at 02/14/15 0906 .  cholecalciferol (VITAMIN D) tablet 1,000 Units, 1,000 Units, Oral,  Daily, Katha Hamming, MD, 1,000 Units at 02/14/15 0906 .  clopidogrel (PLAVIX) tablet 75 mg, 75 mg, Oral, Q breakfast, Iran Ouch, MD, 75 mg at 02/14/15 0906 .  DOBUTamine (DOBUTREX) infusion 4000 mcg/mL, 5 mcg/kg/min, Intravenous, Titrated, Iran Ouch, MD, Stopped at 02/14/15 1400 .  donepezil (ARICEPT) tablet 10 mg, 10 mg, Oral, QHS, Katha Hamming, MD, 10 mg at 02/13/15 2125 .  enoxaparin (LOVENOX) injection 40 mg, 40 mg, Subcutaneous, Q24H, Iran Ouch, MD, 40 mg at 02/15/15 0744 .  feeding supplement (ENSURE ENLIVE) (ENSURE ENLIVE) liquid 237 mL, 237 mL, Oral, TID BM, Altamese Dilling, MD, 237 mL at 02/14/15 1248 .  furosemide (LASIX) injection 40 mg, 40 mg, Intravenous, BID, Enedina Finner, MD, 40 mg at 02/14/15 1822 .  glipiZIDE (GLUCOTROL) tablet 10 mg, 10 mg, Oral, QAC breakfast, Katha Hamming, MD, 10 mg at 02/14/15 0906 .  HYDROcodone-acetaminophen (NORCO/VICODIN) 5-325 MG per tablet 1-2 tablet, 1-2 tablet, Oral, Q4H PRN, Katha Hamming, MD, 2 tablet at 02/13/15 2125 .  Influenza vac split quadrivalent PF (FLUARIX) injection 0.5 mL, 0.5 mL, Intramuscular, Tomorrow-1000, Katha Hamming, MD, 0.5 mL at 02/11/15 1119 .  insulin aspart (novoLOG) injection 0-9 Units, 0-9 Units, Subcutaneous, TID WC, Altamese Dilling, MD, 2 Units at 02/14/15 1244 .  LORazepam (ATIVAN) injection 0.5 mg, 0.5 mg, Intravenous, Q6H PRN, Milagros Loll, MD, 0.5 mg at 02/14/15 1245 .  milrinone (PRIMACOR) 20 MG/100ML (0.2 mg/mL) infusion, 0.375 mcg/kg/min, Intravenous, Continuous, Iran Ouch, MD, Last Rate: 6.1 mL/hr at 02/15/15 0533, 0.375 mcg/kg/min at 02/15/15 0533 .  morphine 2 MG/ML injection 2-4 mg, 2-4 mg, Intravenous, Q1H PRN, Erin Fulling, MD, 4 mg at 02/14/15 1854 .  ondansetron (ZOFRAN) tablet 4 mg, 4 mg, Oral, Q6H PRN **OR** ondansetron (ZOFRAN) injection 4 mg, 4 mg, Intravenous, Q6H PRN, Katha Hamming, MD .  pneumococcal 23 valent vaccine  (PNU-IMMUNE) injection 0.5 mL, 0.5 mL, Intramuscular, Tomorrow-1000, Katha Hamming, MD, 0.5 mL at 02/11/15 1119 .  pravastatin (PRAVACHOL) tablet 80 mg, 80 mg, Oral, Daily, Katha Hamming, MD, 80 mg at 02/14/15 0906 .  sodium chloride flush (NS) 0.9 % injection 3 mL, 3 mL, Intravenous, Q12H, Iran Ouch, MD, 3 mL at 02/14/15 1000 .  sodium chloride flush (NS) 0.9 % injection 3 mL, 3 mL, Intravenous, PRN, Iran Ouch, MD  MAJOR EVENTS/TEST RESULTS: ECHO EF 20%   ASSESSMENT/PLAN   75 yo male admitted to ICU for cardiogenic shock and resp failure in the setting severe malnutrition and dementia   PULMONARY Oxygen as needed -high risk for intubation  CARDIOVASCULAR Shock-cardiogenic -milrenone as per cardiology-follow up recs -prognosis is very poor  RENAL Foley catheter,follow UO  GASTROINTESTINAL  Diet as tolerated  NEUROLOGIC encephalopthy from shock   The Patient requires high complexity decision making for assessment and support, frequent evaluation and titration of therapies, application of advanced monitoring technologies and extensive interpretation of multiple databases. Critical Care Time devoted to patient care services described in this note is 35 minutes.   Overall, patient is critically ill, prognosis is guarded. Patient at high risk for cardiac arrest and death.  Now DNR/DNI status  Kassidie Hendriks Santiago Glad, M.D.  [facility]  Medical Director [facility] Medical Director [facility] Cardio-Pulmonary Department

## 2015-02-15 NOTE — Progress Notes (Signed)
Patient's HR has increased steadily since this morning at 0700 and urine output has only been since 0700. Patient does not appear to be in distress, denies pain and SOB. He is alert and oriented to self/place. Spoke to cardiology and per MD order will change milrinone drip to 0.98mcg/kg/min and give bolus. Family with patient at bedside.

## 2015-02-15 NOTE — Progress Notes (Signed)
Patients son at bedside, request patient to be comfort care. Request milrinone to be discontinued.

## 2015-02-15 NOTE — Progress Notes (Signed)
Family now talking about pt. possibly being made comfort care. Charge nurse made aware. Will continue to monitor pt. At this time pt is still DNR

## 2015-02-15 NOTE — Progress Notes (Signed)
[facility] - [place] at [facility]   PATIENT NAME: Manuel Anthony    MR#:  [id number]  DATE OF BIRTH:  [birth year]  SUBJECTIVE:  CHIEF COMPLAINT:   Chief Complaint  Patient presents with  . Shortness of Breath  . Weakness     Admitted for SOB, weakness. Has baseline dementia.  Cardiac cath 1/26 with DES in SVG to OM.  Continues to be confused.  Family at bedside.  On milrinone drip. Lasix use limited by low BP. Held today AM.  REVIEW OF SYSTEMS:    Review of Systems  Unable to perform ROS: dementia    DRUG ALLERGIES:   Allergies  Allergen Reactions  . Penicillins Other (See Comments)    "passes out"    VITALS:  Blood pressure 87/61, pulse 111, temperature 98.7 F (37.1 C), temperature source Axillary, resp. rate 17, height  (1.676 m), weight 54.3 kg (119 lb 11.4 oz), SpO2 100 %.  PHYSICAL EXAMINATION:  GENERAL:  75 y.o.-year-old thin patient lying in the bed with no acute distress. Drowzy. Thin. EYES: Pupils equal, round, reactive to light and accommodation. No scleral icterus. Extraocular muscles intact.  HEENT: Head atraumatic, normocephalic. Oropharynx and nasopharynx clear. Overall mucosa appears dry. NECK:  Supple, no jugular venous distention. No thyroid enlargement, no tenderness.  LUNGS: Normal breath sounds bilaterally, no wheezing, LL ronchi. No use of accessory muscles of respiration. CARDIOVASCULAR: S1, S2 normal. No murmurs, rubs, or gallops.  ABDOMEN: Soft, nontender, nondistended. Bowel sounds present. No organomegaly or mass.  EXTREMITIES: No pedal edema, cyanosis, or clubbing.  NEUROLOGIC: Cranial nerves II through XII are intact. Muscle strength 4/5 in all extremities. Sensation intact. Gait not checked.  PSYCHIATRIC: The patient is drowzy. Confused SKIN: No obvious rash, lesion, or ulcer.   Physical Exam LABORATORY PANEL:   CBC  Recent Labs Lab 02/14/15 0540  WBC 8.0  HGB 11.4*  HCT 36.0*  PLT 330    ------------------------------------------------------------------------------------------------------------------  Chemistries   Recent Labs Lab 02/10/15 0838  02/13/15 0407  02/15/15 0423  NA 140  < > 135  < > 137  K 3.8  < > 3.8  < > 3.7  CL 106  < > 100*  < > 100*  CO2 24  < > 30  < > 28  GLUCOSE 133*  < > 151*  < > 100*  BUN 18  < > 23*  < > 18  CREATININE 1.02  < > 0.80  < > 0.96  CALCIUM 9.0  < > 8.8*  < > 9.0  MG  --   < > 1.8  --   --   AST 19  --   --   --   --   ALT 14*  --   --   --   --   ALKPHOS 69  --   --   --   --   BILITOT 1.3*  --   --   --   --   < > = values in this interval not displayed. ------------------------------------------------------------------------------------------------------------------  Cardiac Enzymes  Recent Labs Lab 02/10/15 1939 02/11/15 0137  TROPONINI 0.03 <0.03   ------------------------------------------------------------------------------------------------------------------  RADIOLOGY:  No results found.  ASSESSMENT AND PLAN:   Active Problems:   CHF (congestive heart failure) (HCC)   Loss of weight   Abnormal CT scan, gastrointestinal tract   Protein-calorie malnutrition, severe   Acute systolic congestive heart failure (HCC)   Pleural effusion   Depression   Coronary artery  disease involving coronary bypass graft of native heart with unstable angina pectoris (HCC)   SOB (shortness of breath)   Unstable angina (HCC)  # Acute on chronic systolic CHF   On IV Lasix, monitor on telemetry, check daily weights, fluid restriction.  Echo- EF is 25%- he had CABG in [facility]. On Milrinone. Cardiology on Board.  Overall poor prognosis. High risk for cardiac arrest and death.  # CAD Drug-eluting stent placement to SVG to OM - 02/13/2015  # Fatigue, shortness of breath and weight loss concerning for malignancy, CT chest negative for PE but possible  nodules in the lungs. Repeat chest CT scan in 3 months   CT abdomen  showed proctitis or wall thickening, patient has no abdominal pain or diarrhea.    Appreciated GI consult, He may do procedure as out pt.after cardiac issues are stable.  # hypertension: held meds due to hypotension.    With hypotension, not a candidate for any betablocker or ACE at this time.  # Diabetes mellitus type 2: Continue home medications.  # Hypokalemia   As a result of Lasix.    Replace and monitor.  # Acute encephalopathy over dementia Inpatient delirium  All the records are reviewed and case discussed with Care Management/Social Workerr. Management plans discussed with the patient, family and they are in agreement.  CODE STATUS: FUll  TOTAL CC TIME TAKING CARE OF THIS PATIENT: 35 minutes.    Milagros Loll R M.D on 02/15/2015   Between 7am to 6pm - Pager - [phone]  After 6pm go to [email or web address] - password [email or web address] [facility]  [place] [facility]  Office  [phone]  CC: Primary care physician; No PCP Per Patient  Note: This dictation was prepared with Dragon dictation along with smaller phrase technology. Any transcriptional errors that result from this process are unintentional.

## 2015-02-15 NOTE — Progress Notes (Addendum)
Patient beginning to become agitated with painad score of 4. Family states that this happened yesterday afternoon and the day before. Family states that morphine has helped in the past. Will administer morphine as is ordered in [facility].   1447: PAINAD score now 7. Will give another dose of morphine once able to per order in [facility].

## 2015-02-15 NOTE — Progress Notes (Signed)
Per night report the pharmacy is running low on milrinone drips. I called pharmacy and they currently have two more bags and are contacting [facility] to have more delivered. Updated intensivist and will update cardiologist during rounds. Patient resting in bed with family at bedside. VSS.

## 2015-02-15 NOTE — Progress Notes (Signed)
Patient's BP is soft this AM. Spoke to intensivist, will hold dose of IV lasix.

## 2015-02-15 NOTE — Progress Notes (Signed)
eLink Physician-Brief Progress Note Patient Name: Josep Luviano DOB: 06-30-1940 MRN: [id number]   Date of Service  02/15/2015  HPI/Events of Note  Hypotension - SBP = 74.   eICU Interventions  Will bolus with 0.9 NaCl 500 mL IV over 30 minutes now.      Intervention Category Major Interventions: Hypotension - evaluation and management  Luciana Cammarata Eugene 02/15/2015, 10:20 PM

## 2015-02-15 NOTE — Progress Notes (Signed)
eLink Physician-Brief Progress Note Patient Name: Manuel Anthony DOB: [birth year] MRN: [id number]   Date of Service  02/15/2015  HPI/Events of Note  Call from bedside nurse stating that family members are at the bedside and requesting that patient be full comfort care.  Currently patient is DNR/DNI but note communicate that plan is to transition to comfort.  eICU Interventions  Plan: Order set for withdrawal of life-sustaining treatment placed along with morphine gtt.     Intervention Category Major Interventions: End of life / care limitation discussion  DETERDING,ELIZABETH 02/15/2015, 11:48 PM

## 2015-02-16 NOTE — Progress Notes (Signed)
Pt is now comfort care. Pt is resting comfortably with family at bedside.

## 2015-02-16 NOTE — Progress Notes (Signed)
[facility] - [place] at [facility]   PATIENT NAME: Manuel Anthony    MR#:  [id number]  DATE OF BIRTH:  18-Sep-1940  SUBJECTIVE:  CHIEF COMPLAINT:   Chief Complaint  Patient presents with  . Shortness of Breath  . Weakness     Admitted for SOB, weakness. Has baseline dementia. Cardiac cath 1/26 with DES in SVG to OM. Transferred to ICU for ionotrope support Continues to be confused on and off Family at bedside.  Made comfort care overnight at family request.  REVIEW OF SYSTEMS:    Review of Systems  Unable to perform ROS: dementia    DRUG ALLERGIES:   Allergies  Allergen Reactions  . Penicillins Other (See Comments)    "passes out"    VITALS:  Blood pressure 82/65, pulse 103, temperature 98.8 F (37.1 C), temperature source Oral, resp. rate 16, height  (1.676 m), weight 54.3 kg (119 lb 11.4 oz), SpO2 100 %.  PHYSICAL EXAMINATION:  GENERAL:  75 y.o.-year-old thin patient lying in the bed with no acute distress. HEENT: Head atraumatic, normocephalic. Oropharynx and nasopharynx clear. Overall mucosa appears dry. LUNGS: Normal breath sounds bilaterally, no wheezing. No use of accessory muscles of respiration. CARDIOVASCULAR: S1, S2 normal. No murmurs, rubs, or gallops.  ABDOMEN: Soft, nontender, nondistended. Bowel sounds present. No organomegaly or mass.  EXTREMITIES: No pedal edema, cyanosis, or clubbing.  PSYCHIATRIC: The patient is more awake  Physical Exam LABORATORY PANEL:   CBC  Recent Labs Lab 02/14/15 0540  WBC 8.0  HGB 11.4*  HCT 36.0*  PLT 330   ------------------------------------------------------------------------------------------------------------------  Chemistries   Recent Labs Lab 02/10/15 0838  02/13/15 0407  02/15/15 0423  NA 140  < > 135  < > 137  K 3.8  < > 3.8  < > 3.7  CL 106  < > 100*  < > 100*  CO2 24  < > 30  < > 28  GLUCOSE 133*  < > 151*  < > 100*  BUN 18  < > 23*  < > 18  CREATININE  1.02  < > 0.80  < > 0.96  CALCIUM 9.0  < > 8.8*  < > 9.0  MG  --   < > 1.8  --   --   AST 19  --   --   --   --   ALT 14*  --   --   --   --   ALKPHOS 69  --   --   --   --   BILITOT 1.3*  --   --   --   --   < > = values in this interval not displayed. ------------------------------------------------------------------------------------------------------------------  Cardiac Enzymes  Recent Labs Lab 02/10/15 1939 02/11/15 0137  TROPONINI 0.03 <0.03   ------------------------------------------------------------------------------------------------------------------  RADIOLOGY:  No results found.  ASSESSMENT AND PLAN:   Active Problems:   CHF (congestive heart failure) (HCC)   Loss of weight   Abnormal CT scan, gastrointestinal tract   Protein-calorie malnutrition, severe   Acute systolic congestive heart failure (HCC)   Pleural effusion   Depression   Coronary artery disease involving coronary bypass graft of native heart with unstable angina pectoris (HCC)   SOB (shortness of breath)   Unstable angina (HCC)  # Acute on chronic systolic CHF  Echo- EF is 25%- he had CABG in [facility]. Overall poor prognosis. High risk for cardiac arrest and death. # CAD Drug-eluting stent placement to SVG to OM -  02/13/2015 # Fatigue, shortness of breath and weight loss concerning for malignancy, CT chest negative for PE but possible  nodules in the lungs.  # hypertension: held meds due to hypotension. # Diabetes mellitus type 2 # Hypokalemia # Acute encephalopathy over dementia Inpatient delirium   Discussed with family Son, Daughter and sister-in-law at bedside. Patient is expressing wishes to go home where he lives alone. Daughter in from [place]. Son lives here but travels far for work.  They would like him to go to a facility as there is no one to help him at home. They do want to talk and make a decision regarding patients wishes to go home. Advised we will move him out of ICU and  then likely d/c in AM after discussing with Palliative care and Hospice nurse.  Management plans discussed with the patient, family and they are in agreement.  CODE STATUS: FULL  TOTAL TIME TAKING CARE OF THIS PATIENT: 40 minutes with >50% time spent in counseling and co-ordination of care   Orie Fisherman M.D on 02/16/2015   Between 7am to 6pm - Pager - [phone]  After 6pm go to [email or web address] - password [email or web address] [facility]  [place]  [facility]ospital[facility]  Office  [phone]  CC: Primary care physician; No PCP Per Patient  Note: This dictation was prepared with Dragon dictation along with smaller phrase technology. Any transcriptional errors that result from this process are unintentional.

## 2015-02-16 NOTE — Consult Note (Signed)
[facility] [place] [facility] Consultation      Name: Manuel Anthony MRN: [id number] DOB: 1941/01/06    ADMISSION DATE:  02/10/2015    CHIEF COMPLAINT:     Remains in Acute shock and resp distress improved today, the patient has been weaned down to nasal cannula.  HISTORY OF PRESENT ILLNESS  Patient confusion has improved today, though he still remains somewhat confused. Family is at the bedside and confirms patient's improvement. They also confirmed that the patient would like to be made comfort measures only.  Review of Systems  Unable to perform ROS: critical illness      VITAL SIGNS    Temp:  [98.1 F (36.7 C)-98.8 F (37.1 C)] 98.4 F (36.9 C) (01/29 1455) Pulse Rate:  [98-142] 115 (01/29 1455) Resp:  [8-42] 20 (01/29 1455) BP: (74-116)/(50-102) 85/64 mmHg (01/29 1455) SpO2:  [95 %-100 %] 100 % (01/29 1455) HEMODYNAMICS:   VENTILATOR SETTINGS:   INTAKE / OUTPUT:  Intake/Output Summary (Last 24 hours) at 02/16/15 1533 Last data filed at 02/16/15 0900  Gross per 24 hour  Intake    240 ml  Output   1350 ml  Net  -1110 ml       PHYSICAL EXAM   Physical Exam  Constitutional: No distress.  Frail, very thin, cachectic Temporal waisting   HENT:  Head: Normocephalic and atraumatic.  Eyes: Pupils are equal, round, and reactive to light.  Neck: Normal range of motion. Neck supple.  Cardiovascular: Normal rate and regular rhythm.  Exam reveals no friction rub.   No murmur heard. Pulmonary/Chest: No respiratory distress. He has no wheezes. He has no rales.  Abdominal: Soft. Bowel sounds are normal.  Musculoskeletal: He exhibits no edema.  Neurological: He displays normal reflexes. Coordination normal.  confused  Skin: Skin is warm. No rash noted. He is not diaphoretic.       LABS   LABS:  CBC  Recent Labs Lab 02/12/15 0401 02/13/15 1424 02/14/15 0540  WBC 7.9 6.0 8.0  HGB 12.2* 12.2* 11.4*  HCT 37.8* 38.2* 36.0*  PLT 360 370 330    Coag's No results for input(s): APTT, INR in the last 168 hours. BMET  Recent Labs Lab 02/13/15 0407 02/13/15 1424 02/14/15 0540 02/15/15 0423  NA 135  --  129* 137  K 3.8  --  4.8 3.7  CL 100*  --  97* 100*  CO2 30  --  26 28  BUN 23*  --  20 18  CREATININE 0.80 0.84 0.90 0.96  GLUCOSE 151*  --  171* 100*   Electrolytes  Recent Labs Lab 02/12/15 0401 02/13/15 0407 02/14/15 0540 02/15/15 0423  CALCIUM 8.3* 8.8* 8.7* 9.0  MG 1.4* 1.8  --   --    Sepsis Markers No results for input(s): LATICACIDVEN, PROCALCITON, O2SATVEN in the last 168 hours. ABG No results for input(s): PHART, PCO2ART, PO2ART in the last 168 hours. Liver Enzymes  Recent Labs Lab 02/10/15 0838  AST 19  ALT 14*  ALKPHOS 69  BILITOT 1.3*  ALBUMIN 3.4*   Cardiac Enzymes  Recent Labs Lab 02/10/15 1346 02/10/15 1939 02/11/15 0137  TROPONINI 0.03 0.03 <0.03   Glucose  Recent Labs Lab 02/14/15 1707 02/14/15 2136 02/15/15 0805 02/15/15 1138 02/15/15 1824 02/15/15 2138  GLUCAP 96 88 101* 201* 93 113*     Recent Results (from the past 240 hour(s))  MRSA PCR Screening     Status: None   Collection Time: 02/14/15 10:53 AM  Result  Value Ref Range Status   MRSA by PCR NEGATIVE NEGATIVE Final    Comment:        The GeneXpert MRSA Assay (FDA approved for NASAL specimens only), is one component of a comprehensive MRSA colonization surveillance program. It is not intended to diagnose MRSA infection nor to guide or monitor treatment for MRSA infections.      Current facility-administered medications:  .  acetaminophen (TYLENOL) tablet 650 mg, 650 mg, Oral, Q6H PRN **OR** acetaminophen (TYLENOL) suppository 650 mg, 650 mg, Rectal, Q6H PRN, Katha Hamming, MD .  HYDROcodone-acetaminophen (NORCO/VICODIN) 5-325 MG per tablet 1-2 tablet, 1-2 tablet, Oral, Q4H PRN, Katha Hamming, MD, 2 tablet at 02/13/15 2125 .  LORazepam (ATIVAN) injection 0.5 mg, 0.5 mg, Intravenous, Q6H  PRN, Milagros Loll, MD, 0.5 mg at 02/14/15 1245 .  morphine  in NS ( /mL) infusion - premix, 10 mg/hr, Intravenous, Continuous, Zigmund Gottron, MD, Stopped at 02/16/15 0000 .  morphine 2 MG/ML injection 2-4 mg, 2-4 mg, Intravenous, Q1H PRN, Erin Fulling, MD, 4 mg at 02/15/15 1627 .  morphine bolus via infusion 5-20 mg, 5-20 mg, Intravenous, Q15 min PRN, Dorise Hiss Deterding, MD .  ondansetron (ZOFRAN) tablet 4 mg, 4 mg, Oral, Q6H PRN **OR** ondansetron (ZOFRAN) injection 4 mg, 4 mg, Intravenous, Q6H PRN, Katha Hamming, MD  MAJOR EVENTS/TEST RESULTS: ECHO EF 20%   ASSESSMENT/PLAN   74 yo male admitted to ICU for cardiogenic shock and resp failure in the setting severe malnutrition and dementia   PULMONARY Oxygen as needed -Patient is now DO NOT RESUSCITATE with comfort measures only.  CARDIOVASCULAR Shock-cardiogenic -milrenone as per cardiology-follow up recs -prognosis is very poor  RENAL Foley catheter,follow UO  GASTROINTESTINAL Diet as tolerated  NEUROLOGIC encephalopthy from shock   Discussed with family and patient at bedside, the patient is improved and his comfort measures only. Can transfer him out of the intensive care unit to the general medical floor. Palliative care consultation would also be helpful in terms of transitioning this patient to hospice.  Deep Cherene Altes.D.

## 2015-02-17 ENCOUNTER — Encounter: Payer: Self-pay | Admitting: Cardiovascular Disease

## 2015-02-17 DIAGNOSIS — E119 Type 2 diabetes mellitus without complications: Secondary | ICD-10-CM

## 2015-02-17 DIAGNOSIS — I2510 Atherosclerotic heart disease of native coronary artery without angina pectoris: Secondary | ICD-10-CM

## 2015-02-17 DIAGNOSIS — Z87891 Personal history of nicotine dependence: Secondary | ICD-10-CM

## 2015-02-17 DIAGNOSIS — Z515 Encounter for palliative care: Secondary | ICD-10-CM

## 2015-02-17 DIAGNOSIS — I5021 Acute systolic (congestive) heart failure: Secondary | ICD-10-CM

## 2015-02-17 DIAGNOSIS — I429 Cardiomyopathy, unspecified: Secondary | ICD-10-CM

## 2015-02-17 DIAGNOSIS — I10 Essential (primary) hypertension: Secondary | ICD-10-CM

## 2015-02-17 LAB — ALBUMIN: ALBUMIN: 3 g/dL — AB (ref 3.5–5.0)

## 2015-02-17 NOTE — Progress Notes (Signed)
[facility] - [place] at [facility]   PATIENT NAME: Manuel Anthony    MR#:  [id number]  DATE OF BIRTH:  September 13, 1940  SUBJECTIVE:  CHIEF COMPLAINT:   Chief Complaint  Patient presents with  . Shortness of Breath  . Weakness     Admitted for SOB, weakness. Has baseline dementia. Cardiac cath 1/26 with DES in SVG to OM. Transferred to ICU for ionotrope support  Does have dementia  Family at bedside.  REVIEW OF SYSTEMS:    Review of Systems  Unable to perform ROS: dementia    DRUG ALLERGIES:   Allergies  Allergen Reactions  . Penicillins Other (See Comments)    "passes out"    VITALS:  Blood pressure 83/62, pulse 104, temperature 97.4 F (36.3 C), temperature source Oral, resp. rate 16, height  (1.676 m), weight 54.3 kg (119 lb 11.4 oz), SpO2 98 %.  PHYSICAL EXAMINATION:  GENERAL:  75 y.o.-year-old thin patient lying in the bed with no acute distress. HEENT: Head atraumatic, normocephalic. Oropharynx and nasopharynx clear. Overall mucosa appears dry. LUNGS: Normal breath sounds bilaterally, no wheezing. No use of accessory muscles of respiration. CARDIOVASCULAR: S1, S2 normal. No murmurs, rubs, or gallops.  ABDOMEN: Soft, nontender, nondistended. Bowel sounds present. No organomegaly or mass.  EXTREMITIES: No pedal edema, cyanosis, or clubbing.  PSYCHIATRIC: The patient is awake  Physical Exam LABORATORY PANEL:   CBC  Recent Labs Lab 02/14/15 0540  WBC 8.0  HGB 11.4*  HCT 36.0*  PLT 330   ------------------------------------------------------------------------------------------------------------------  Chemistries   Recent Labs Lab 02/13/15 0407  02/15/15 0423  NA 135  < > 137  K 3.8  < > 3.7  CL 100*  < > 100*  CO2 30  < > 28  GLUCOSE 151*  < > 100*  BUN 23*  < > 18  CREATININE 0.80  < > 0.96  CALCIUM 8.8*  < > 9.0  MG 1.8  --   --   < > = values in this interval not  displayed. ------------------------------------------------------------------------------------------------------------------  Cardiac Enzymes  Recent Labs Lab 02/10/15 1939 02/11/15 0137  TROPONINI 0.03 <0.03   ------------------------------------------------------------------------------------------------------------------  RADIOLOGY:  No results found.  ASSESSMENT AND PLAN:   Active Problems:   CHF (congestive heart failure) (HCC)   Loss of weight   Abnormal CT scan, gastrointestinal tract   Protein-calorie malnutrition, severe   Acute systolic congestive heart failure (HCC)   Pleural effusion   Depression   Coronary artery disease involving coronary bypass graft of native heart with unstable angina pectoris (HCC)   SOB (shortness of breath)   Unstable angina (HCC)  # Acute on chronic systolic CHF  Echo- EF is 25%- he had CABG in [facility]. Overall poor prognosis. High risk for cardiac arrest and death. # CAD Drug-eluting stent placement to SVG to OM - 02/13/2015 # Fatigue, shortness of breath and weight loss concerning for malignancy, CT chest negative for PE but possible  nodules in the lungs.  # hypertension: held meds due to hypotension. # Diabetes mellitus type 2 # Hypokalemia # Acute encephalopathy over dementia Inpatient delirium   Discussed with family again today Patient is expressing wishes to go home where he lives alone. Family unable to take him home as no one can be with him  They would like him to go to a facility as there is no one to help him at home. T I have discussed with Case manager and Dr. Orvan Falconer of palliative care regarding  disposition  Management plans discussed with the patient, family and they are in agreement.  CODE STATUS: FULL  TOTAL TIME TAKING CARE OF THIS PATIENT: 35 minutes with >50% time spent in counseling and co-ordination of care   Milagros Loll R M.D on 73/30/2017   Between 7am to 6pm - Pager - [phone]  After 6pm  go to [email or web address] - password [email or web address] [facility]  [place] [facility]  Office  [phone]  CC: Primary care physician; No PCP Per Patient  Note: This dictation was prepared with Dragon dictation along with smaller phrase technology. Any transcriptional errors that result from this process are unintentional.

## 2015-02-17 NOTE — Consult Note (Signed)
Palliative Medicine Inpatient Consult Note   Name: Manuel Anthony Date: 02/17/2015 MRN: [id number]  DOB: [birth year]  Referring Physician: Milagros Loll, MD  Palliative Care consult requested for this 75 y.o. male for goals of medical therapy in patient with severe cardiomyopathy.  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     TODAY'S DISCUSSIONS AND DECISIONS: 1.  Pt is DNR/ DNI status.  2.  Pt is also only on comfort meds. This is b/c family was told he could die at anytime, and b/c pt was agitated and floridly confused and it seemed that he would not 'come to' and start eating again, etc.  BUT, pt has rallied since he was taken off all his heart meds.  Not agitated. He is talking and he ate well today.    3.  This puts pt in a bit of a quandry.  He seems to be improving as we have decreased his meds to comfort only meds.  And this means he is not really showing signs of 'actively dying'. Therefore, he is not really a Hospice Home candidate (which is what family was looking for him to be --when pt began to 'rally' and improve).  [facility] M[facility] so the [place] can determine if they will provide a transition to some type of Hospice care.  It is unlikely that he will be deemed appropriate 'today' for a hospice home type of setting by the [place].    4.  Pt is quite appropriate for Hospice Care.  But, family does not want him home alone getting hospice there for several reasons: his dementia has progressed and he is not safe alone, and they cannot be there for him, and even if they could get help in on a paid basis, the pt would never permit that (as he hates people coming into his home).  Family was only able to take his driving privileges away a couple of months ago --pt won't let the car be taken however.    5.  Pt hasn't gotten out of bed except with therapy, but pt has improved cognitively since that last therapy  eval and family would like another PT eval to see if he could go to a skilled facility.  I agree with this request.  Pt is deconditioned from a critical illness and he seems much improved from two days ago and even from yesterday (based on review of notes and what people are telling me).  He warrants another PT eval to see if he could benefit from a skilled placement.    6.  I have told the three adult children that I would be able to certify that he is appropriate for Hospice Care --meaning he has a high likelihood of not being alive in 6 months time.  But --he does not seem appropriate for Hospice home.  That could, however, change anytime --especially since he is OFF of his heart meds and diuretics.   7. He truly does not seem to be one who would be safe at home --due to dementia.  8.  Family says he is not impoverished so he would not be a Medicaid candidate for long term care. He does get a [facility] check etc --and it may be that he might have to go to a long term care facility under private pay.    9.  I will request another PT  eval and also will talk with case manager about [facility] involvement.   BUT I let family be aware that I agree with Hospice Care --but I cannot fix WHERE that takes place. That will be a team effort.    10. Family wants to keep him only on comfort meds --even if he goes for STR (he would need a Palliative Care consult if going to STR).  Pt thinks he can go home and he is asking for his keys.     I will touch base with family, attending, care mgr, social worker, and PT tomorrow.     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 ---with EF 20-25% ---with associated dyspnea HTN DM2 CAD --h/o CABG (23 vessel)  at [facility] --had cath 1/26 with stent --has unstable angina  Former Smoker Dyslipidemia Alzheimers Dementia on Aricept Wt Loss ---possibly partly due to grief (wife died Jul 14, 2022 of last year) Moderate Malnutrition ---Albumin is  3.0 Loss of appetite Depression Proctitis ---but cancer cannot be ruled out by imaging ---not stable enough from cardiac standpoint for colonoscopy per GI consult Hypotension Metabolic Encephalopathy Hypokalemia Moderate Bilateral Pleural Effusions Chronic Pain with agitation when in pain --morphine helps per family  right greater than left per CT Focal Fatty Liver infiltration Scarring / nodules in lungs Dry mouth   REVIEW OF SYSTEMS:  Patient is not able to provide ROS reliably --he is very tangential.  SPIRITUAL SUPPORT SYSTEM: Yes --family.  SOCIAL HISTORY:  reports that he quit smoking about 20 years ago. His smoking use included Cigarettes. He has a 20 pack-year smoking history. He does not have any smokeless tobacco history on file. He reports that he does not drink alcohol or use illicit drugs.  LEGAL DOCUMENTS:  I have placed a portable DNR document in the pts paper chart  CODE STATUS: DNR / DNI as of 1/27 per discussion w/ Dr Belia Heman  PAST MEDICAL HISTORY: Past Medical History  Diagnosis Date  . Diabetes mellitus without complication (HCC)   . CHF (congestive heart failure) (HCC)   . CAD (coronary artery disease)     a. CABG in 2003 following cath which showed 99% stenosis RCA, 99% LAD and 90% LCx  . Chronic systolic CHF (congestive heart failure) (HCC)     a. 02/11/2012: EF 20-25% by echo with diffuse hypokinesis  . HLD (hyperlipidemia)     PAST SURGICAL HISTORY:  Past Surgical History  Procedure Laterality Date  . Cardiac surgery    . Hernia repair    . Cardiac catheterization Right 02/13/2015    Procedure: Right and Left Heart Cath;  Surgeon: Antonieta Iba, MD;  Location: [facility] INVASIVE CV LAB;  Service: Cardiovascular;  Laterality: Right;  . Cardiac catheterization N/A 02/13/2015    Procedure: Coronary Stent Intervention;  Surgeon: Iran Ouch, MD;  Location: [facility] INVASIVE CV LAB;  Service: Cardiovascular;  Laterality: N/A;    ALLERGIES:  is  allergic to penicillins.  MEDICATIONS:  Current Facility-Administered Medications  Medication Dose Route Frequency Provider Last Rate Last Dose  . acetaminophen (TYLENOL) tablet 650 mg  650 mg Oral Q6H PRN Katha Hamming, MD       Or  . acetaminophen (TYLENOL) suppository 650 mg  650 mg Rectal Q6H PRN Katha Hamming, MD      . HYDROcodone-acetaminophen (NORCO/VICODIN) 5-325 MG per tablet 1-2 tablet  1-2 tablet Oral Q4H PRN Katha Hamming, MD   2 tablet at 02/13/15 2125  . LORazepam (ATIVAN) injection 0.5 mg  0.5 mg Intravenous Q6H PRN Milagros Loll, MD   0.5 mg at 02/16/15 2217  . morphine 2 MG/ML injection 2-4 mg  2-4 mg Intravenous Q1H PRN Erin Fulling, MD   4 mg at 02/15/15 1627  . ondansetron (ZOFRAN) tablet 4 mg  4 mg Oral Q6H PRN Katha Hamming, MD       Or  . ondansetron (ZOFRAN) injection 4 mg  4 mg Intravenous Q6H PRN Katha Hamming, MD        Vital Signs: BP 83/62 mmHg  Pulse 104  Temp(Src) 97.4 F (36.3 C) (Oral)  Resp 16  Ht 5\' 6"  (1.676 m)  Wt 54.3 kg (119 lb 11.4 oz)  BMI 19.33 kg/m2  SpO2 98% Filed Weights   02/14/15 0500 02/14/15 1045 02/15/15 0406  Weight: 54.114 kg (119 lb 4.8 oz) 54.3 kg (119 lb 11.4 oz) 54.3 kg (119 lb 11.4 oz)    Estimated body mass index is 19.33 kg/(m^2) as calculated from the following:   Height as of this encounter: 5\' 6"  (1.676 m).   Weight as of this encounter: 54.3 kg (119 lb 11.4 oz).  PERFORMANCE STATUS (ECOG) : 3 - Symptomatic, >50% confined to bed  PHYSICAL EXAM: NAD Alert and talkative But not rational.  Has no appreciation whatsoever of his current condition Has short term recall and some long term recall memory deficits Confabulates and makes statements to support his impression that he is 'fine now' --but these make no sense though he tries to build a good case for this. Cachectic (but he reportedly did not weigh more than about 145 lbs at baseline per family) Temporal wasting is noted EOMI OP  clear Neck w/o JVD or TM Hrt rrr with 2/6 HSM Lungs cta and no rales but decreased BS in bases Abd soft and NT Ext no mottling or cyanosis   LABS: CBC:    Component Value Date/Time   WBC 8.0 02/14/2015 0540   HGB 11.4* 02/14/2015 0540   HCT 36.0* 02/14/2015 0540   PLT 330 02/14/2015 0540   MCV 79.1* 02/14/2015 0540   NEUTROABS 4.6 02/10/2015 0838   LYMPHSABS 1.6 02/10/2015 0838   MONOABS 0.3 02/10/2015 0838   EOSABS 0.0 02/10/2015 0838   BASOSABS 0.1 02/10/2015 0838   Comprehensive Metabolic Panel:    Component Value Date/Time   NA 137 02/15/2015 0423   K 3.7 02/15/2015 0423   CL 100* 02/15/2015 0423   CO2 28 02/15/2015 0423   BUN 18 02/15/2015 0423   CREATININE 0.96 02/15/2015 0423   GLUCOSE 100* 02/15/2015 0423   CALCIUM 9.0 02/15/2015 0423   AST 19 02/10/2015 0838   ALT 14* 02/10/2015 0838   ALKPHOS 69 02/10/2015 0838   BILITOT 1.3* 02/10/2015 0838   PROT 7.6 02/10/2015 0838   ALBUMIN 3.0* 02/17/2015 1402   TESTS:  Echo 02/11/15: - Left ventricle: The cavity size was moderately dilated. Systolic function was severely reduced. The estimated ejection fraction was in the range of 20% to 25%. Diffuse hypokinesis. Regional wall motion abnormalities cannot be excluded. The study is not technically sufficient to allow evaluation of LV diastolic function. - Mitral valve: There was mild to moderate regurgitation. - Right ventricle: The cavity size was mildly dilated. Wall thickness was normal. Systolic function was moderately reduced. - Right atrium: The atrium was mildly dilated. - Pulmonary arteries: Systolic pressure was mild to moderately dilated. PA peak pressure: 49 mm Hg (S).  More than 50% of the visit was spent in counseling/coordination of care:  Yes  Time Spent:  80 minutes

## 2015-02-17 NOTE — Care Management (Signed)
Telephone call to Manuel Anthony at [facility]. States that Manuel Anthony is 70% vested. If he wanted [facility] to pay for skilled nursing, he would need to be transferred to [facility]. The arrangements to go to a skilled nursing unit would need to go thru their staff If family members would like a Hospice facility. Will need to fax updated progress notes and history & physical to [employer]. Manuel Anthony would forward this information to [facility]. [facility] does pay for Hospice services. Will fax requested information to Manuel. [name]. Dr. Loney Laurence is planning a family conference today. Gwenette Greet RN MSN CCM Care Management [phone]

## 2015-02-17 NOTE — Progress Notes (Signed)
Nutrition Brief Note  Chart reviewed. Pt now transitioning to comfort care.  No further nutrition interventions warranted at this time.  Please re-consult as needed.   Arland Usery B. Yamilee Harmes, RD, LDN [phone] (pager) Weekend/On-Call pager ([phone])    

## 2015-02-17 NOTE — Progress Notes (Signed)
Spoke with Dr. Elpidio Anis about pt's family refusing morphine drip.  Pt does not appear in pain at this time.  Dr. Elpidio Anis gave telephone order to d/c morphine drip.   Orson Ape, RN

## 2015-02-17 NOTE — Care Management Important Message (Signed)
Important Message  Patient Details  Name: Manuel Anthony MRN: [id number] Date of Birth: 07-23-1940   Medicare Important Message Given:  Yes    Olegario Messier A Avalyn Molino 02/17/2015, 1:32 PM

## 2015-02-18 DIAGNOSIS — F0280 Dementia in other diseases classified elsewhere without behavioral disturbance: Secondary | ICD-10-CM

## 2015-02-18 DIAGNOSIS — G300 Alzheimer's disease with early onset: Secondary | ICD-10-CM

## 2015-02-18 MED ORDER — BENZOCAINE-MENTHOL 6-10 MG MT LOZG
1.0000 | LOZENGE | OROMUCOSAL | Status: DC | PRN
  Filled 2015-02-18: qty 2

## 2015-02-18 MED ORDER — GUAIFENESIN-DM 100-10 MG/5ML PO SYRP: 5.0000 mL | ORAL_SOLUTION | ORAL | Status: DC | PRN

## 2015-02-18 MED ORDER — CEPASTAT 14.5 MG MT LOZG
1.0000 | LOZENGE | OROMUCOSAL | Status: DC | PRN
  Filled 2015-02-18: qty 9

## 2015-02-18 NOTE — Progress Notes (Signed)
Palliative Medicine Inpatient Consult Follow Up Note   Name: Manuel Anthony Date: 02/18/2015 MRN: [id number]  DOB: Sep 04, 1940  Referring Physician: Milagros Loll, MD  Palliative Care consult requested for this 75 y.o. male for goals of medical therapy in patient with severe cardiomyopathy.  The family (three adult children) are all in the room --waiting for a re-eval by PT.   TODAY'S DISCUSSIONS:   Pt has 'rallied'/ improved quite a bit --all since he was taken off of his heart meds.  He apparently had a 'come to [place]' meeting with himself and with family last night after I left --after I told him how weak his heart is now.  He told family he would be willing to work with therapy and / or be willing to have people come into his home to help him.  This is a good thing--since at this time we are looking at either Short Term Rehab --or home with hospice (long term hospice --not imminently dying terminal right now hospice type care).    He is in an odd situation in that he would benefit greatly from being empowered to move about and do more for himself.  His strength in his arms and legs is quite good !  He follows directions and participates.  BUT, despite being someone who might benefit from a short term rehab stay / therapy somewhere, he is to be on comfort meds only (partly because he is doing better on the comfort meds and also partly b/c family doesn't want to prolong his life and watch his Alzheimers worsen over time.  They would like him comfortable and happy for the days he has remaining w/o a lot of 'treatment/ cardiac' meds.    We await PT to see if pt is appropreate now for STR.   If not, family is going to have to figure out some type of placement for him or get help in the home as he is not safe alone due to his moderate and advancing dementia.    He remains DNR and on comfort meds only.  IMPRESSION: CHF ---was on milronone drip --ALL CARDIAC MEDS ARE NOW dcD ---Acute on Chronic  Systolic Cardiomyopathy ---with EF 20-25% ---with associated dyspnea HTN DM2 CAD --h/o CABG (23 vessel) at [facility] --had cath 1/26 with stent --has unstable angina  Former Smoker Dyslipidemia Alzheimers Dementia on Aricept Wt Loss ---possibly partly due to grief (wife died 2022-08-05 of last year) Moderate Malnutrition ---Albumin is 3.0 Loss of appetite Depression Proctitis ---but cancer cannot be ruled out by imaging ---not stable enough from cardiac standpoint for colonoscopy per GI consult Hypotension Metabolic Encephalopathy Hypokalemia Moderate Bilateral Pleural Effusions Chronic Pain with agitation when in pain --morphine helps per family right greater than left per CT Focal Fatty Liver infiltration Scarring / nodules in lungs Dry mouth    CODE STATUS: DNR   PAST MEDICAL HISTORY: Past Medical History  Diagnosis Date  . Diabetes mellitus without complication (HCC)   . CHF (congestive heart failure) (HCC)   . CAD (coronary artery disease)     a. CABG in 2003 following cath which showed 99% stenosis RCA, 99% LAD and 90% LCx  . Chronic systolic CHF (congestive heart failure) (HCC)     a. 02/11/2012: EF 20-25% by echo with diffuse hypokinesis  . HLD (hyperlipidemia)     PAST SURGICAL HISTORY:  Past Surgical History  Procedure Laterality Date  . Cardiac surgery    . Hernia repair    . Cardiac catheterization Right  02/13/2015    Procedure: Right and Left Heart Cath;  Surgeon: Antonieta Iba, MD;  Location: [facility] INVASIVE CV LAB;  Service: Cardiovascular;  Laterality: Right;  . Cardiac catheterization N/A 02/13/2015    Procedure: Coronary Stent Intervention;  Surgeon: Iran Ouch, MD;  Location: [facility] INVASIVE CV LAB;  Service: Cardiovascular;  Laterality: N/A;    Vital Signs: BP 96/72 mmHg  Pulse 117  Temp(Src) 97.5 F (36.4 C) (Oral)  Resp 20  Ht  (1.676 m)  Wt 54.3 kg (119 lb 11.4 oz)  BMI 19.33 kg/m2  SpO2 100% Filed Weights   02/14/15 0500  02/14/15 1045 02/15/15 0406  Weight: 54.114 kg (119 lb 4.8 oz) 54.3 kg (119 lb 11.4 oz) 54.3 kg (119 lb 11.4 oz)    Estimated body mass index is 19.33 kg/(m^2) as calculated from the following:   Height as of this encounter:  (1.676 m).   Weight as of this encounter: 54.3 kg (119 lb 11.4 oz).  PHYSICAL EXAM: Alert Confabulating a bit and making jokes --but grossly oriented today to place and a bit more to situation than he was last night. EOMI Temporal wasting noted OP clear no exudate Neck no JVD or TM Hrt rrr soft distant HS  Lungs with decreased BS in very bases Abd soft NT Ext no mottling or cyanosis Muscle strength is 5/5 in all 4 extremities  LABS: CBC:    Component Value Date/Time   WBC 8.0 02/14/2015 0540   HGB 11.4* 02/14/2015 0540   HCT 36.0* 02/14/2015 0540   PLT 330 02/14/2015 0540   MCV 79.1* 02/14/2015 0540   NEUTROABS 4.6 02/10/2015 0838   LYMPHSABS 1.6 02/10/2015 0838   MONOABS 0.3 02/10/2015 0838   EOSABS 0.0 02/10/2015 0838   BASOSABS 0.1 02/10/2015 0838   Comprehensive Metabolic Panel:    Component Value Date/Time   NA 137 02/15/2015 0423   K 3.7 02/15/2015 0423   CL 100* 02/15/2015 0423   CO2 28 02/15/2015 0423   BUN 18 02/15/2015 0423   CREATININE 0.96 02/15/2015 0423   GLUCOSE 100* 02/15/2015 0423   CALCIUM 9.0 02/15/2015 0423   AST 19 02/10/2015 0838   ALT 14* 02/10/2015 0838   ALKPHOS 69 02/10/2015 0838   BILITOT 1.3* 02/10/2015 0838   PROT 7.6 02/10/2015 0838   ALBUMIN 3.0* 02/17/2015 1402     More than 50% of the visit was spent in counseling/coordination of care: YES  Time Spent: 25 min

## 2015-02-18 NOTE — NC FL2 (Signed)
[place] MEDICAID FL2 LEVEL OF CARE SCREENING TOOL     IDENTIFICATION  Patient Name: Manuel Anthony Birthdate: October 24, 1940 Sex: male Admission Date (Current Location): 02/10/2015  [place] and [place] Number:  Chiropodist and Address:  [facility], [address], [place], [place] [address]      Provider Number: [id number]  Attending Physician Name and Address:  Milagros Loll, MD  Relative Name and Phone Number:       Current Level of Care: SNF Recommended Level of Care: Skilled Nursing Facility Prior Approval Number:    Date Approved/Denied:   PASRR Number: [id number]  Discharge Plan: SNF    Current Diagnoses: Patient Active Problem List   Diagnosis Date Noted  . Unstable angina (HCC)   . Acute systolic congestive heart failure (HCC)   . Pleural effusion   . Depression   . Coronary artery disease involving coronary bypass graft of native heart with unstable angina pectoris (HCC)   . SOB (shortness of breath)   . Protein-calorie malnutrition, severe 02/11/2015  . Loss of weight   . Abnormal CT scan, gastrointestinal tract   . CHF (congestive heart failure) (HCC) 02/10/2015    Orientation RESPIRATION BLADDER Height & Weight     Self, Time  Normal Continent Weight: 119 lb 11.4 oz (54.3 kg) Height:   (167.6 cm)  BEHAVIORAL SYMPTOMS/MOOD NEUROLOGICAL BOWEL NUTRITION STATUS      Continent Diet (Heart Healthy, Thin Liquids)  AMBULATORY STATUS COMMUNICATION OF NEEDS Skin   Limited Assist   Normal                       Personal Care Assistance Level of Assistance  Bathing, Feeding, Dressing Bathing Assistance: Limited assistance Feeding assistance: Independent Dressing Assistance: Limited assistance     Functional Limitations Info  Sight, Hearing, Speech Sight Info: Adequate Hearing Info: Impaired Speech Info: Adequate    SPECIAL CARE FACTORS FREQUENCY  PT (By licensed PT)     PT Frequency: 5               Contractures      Additional Factors Info  Code Status, Allergies Code Status Info: DNR Allergies Info: Penicillins           Current Medications (02/18/2015):  This is the current hospital active medication list Current Facility-Administered Medications  Medication Dose Route Frequency Provider Last Rate Last Dose  . acetaminophen (TYLENOL) tablet 650 mg  650 mg Oral Q6H PRN Katha Hamming, MD       Or  . acetaminophen (TYLENOL) suppository 650 mg  650 mg Rectal Q6H PRN Katha Hamming, MD      . guaiFENesin-dextromethorphan (ROBITUSSIN DM) 100-10 MG/5ML syrup 5 mL  5 mL Oral Q4H PRN Oralia Manis, MD      . HYDROcodone-acetaminophen (NORCO/VICODIN) 5-325 MG per tablet 1-2 tablet  1-2 tablet Oral Q4H PRN Katha Hamming, MD   2 tablet at 02/13/15 2125  . LORazepam (ATIVAN) injection 0.5 mg  0.5 mg Intravenous Q6H PRN Milagros Loll, MD   0.5 mg at 02/16/15 2217  . morphine 2 MG/ML injection 2-4 mg  2-4 mg Intravenous Q1H PRN Erin Fulling, MD   4 mg at 02/15/15 1627  . ondansetron (ZOFRAN) tablet 4 mg  4 mg Oral Q6H PRN Katha Hamming, MD       Or  . ondansetron (ZOFRAN) injection 4 mg  4 mg Intravenous Q6H PRN Katha Hamming, MD      .  phenol-menthol (CEPASTAT) lozenge 1 lozenge  1 lozenge Buccal PRN Oralia Manis, MD         Discharge Medications: Please see discharge summary for a list of discharge medications.  Relevant Imaging Results:  Relevant Lab Results:   Additional Information SSN: [id number]  Dede Query, LCSW

## 2015-02-18 NOTE — Care Management (Signed)
Updated Palliative Care notes faxed to [facility]. Family members updated.  Physical therapy evaluation ordered. Gwenette Greet RN MSN CCM Care Management [phone]

## 2015-02-18 NOTE — Evaluation (Signed)
Physical Therapy Evaluation Patient Details Name: Manuel Anthony MRN: [id number] DOB: Jul 08, 1940 Today's Date: 02/18/2015   History of Present Illness  presented to ER with progressive weakness, SOB; admitted with acute CHF, initial onset.  Hospital course (since initial evaluation) significant for cardiogenic shock after cardiac catheterization (1/27) requiring transfer to CCU for management of respiratory decline and dobutamine therapy.  Family then transitioned patient to comfort care (1/28), but patient has since 'rallied' and is doing well on 'comfort only medicines'.  Now interested in pursing STR if appropriate; new PT order received.  Clinical Impression  Upon evaluation, patient alert and oriented to self only; generally confused and forgetful, but pleasant and cooperative.  Demonstrates strength and ROM grossly WFL (unchanged since initial evaluation) despite tenuous hospital course.  Denies pain; maintains vitals stable and WFL (on RA) throughout session.  Requiring min assist for all transfers/mobility without use of RW; improved to cga with use of RW for all mobility.  Mild/mod higher-level balance deficits and fall risk noted, as evidenced by inability to maintain romberg/SLS positions without LOB or assist from therapist.  Recommend continued use of RW and +1 sup/assist for all mobility at this time. Would benefit from skilled PT to address above deficits and promote optimal return to PLOF; recommend transition to STR upon discharge from acute hospitalization.     Follow Up Recommendations SNF    Equipment Recommendations  Rolling walker with 5" wheels    Recommendations for Other Services       Precautions / Restrictions Precautions Precautions: Fall Restrictions Weight Bearing Restrictions: No      Mobility  Bed Mobility Overal bed mobility: Independent                Transfers Overall transfer level: Needs assistance Equipment used: None Transfers: Sit  to/from Stand Sit to Stand: Min assist;Min guard            Ambulation/Gait Ambulation/Gait assistance: Min assist Ambulation Distance (Feet): 50 Feet Assistive device: None       General Gait Details: narrowed BOS with LEs occasionally crossing midline; veers laterally at times, somewhat staggering, reaching for walls/rails for external stabilization.  Guarded trunk rotation with limited arm swing.  Limited insight into deficits  Stairs            Wheelchair Mobility    Modified Rankin (Stroke Patients Only)       Balance Overall balance assessment: Needs assistance Sitting-balance support: Feet supported;No upper extremity supported Sitting balance-Leahy Scale: Normal     Standing balance support: No upper extremity supported Standing balance-Leahy Scale: Fair   Single Leg Stance - Right Leg: 4 Single Leg Stance - Left Leg: 3     Rhomberg - Eyes Opened: 15 Rhomberg - Eyes Closed: 10   High Level Balance Comments: increased postural sway in all directions with higher-level balance activities, relying heavily on LE step strategy for balance recovery             Pertinent Vitals/Pain Pain Assessment: No/denies pain    Home Living Family/patient expects to be discharged to:: Private residence Living Arrangements: Alone Available Help at Discharge: Family Type of Home: House Home Access: Stairs to enter Entrance Stairs-Rails: Right Entrance Stairs-Number of Steps: 3 Home Layout: One level        Prior Function Level of Independence: Independent         Comments: Indep with ADLs and household mobility; denies fall history.  Approx 6 months ago, was able to ambulate distances 2-3  miles at a time without difficulty (progressive decline since).  Sons provide frequent check in and provide transportation/assist with errands as needed.     Hand Dominance        Extremity/Trunk Assessment   Upper Extremity Assessment: Overall WFL for tasks  assessed           Lower Extremity Assessment: Overall WFL for tasks assessed         Communication   Communication: No difficulties  Cognition Arousal/Alertness: Awake/alert Behavior During Therapy: WFL for tasks assessed/performed Overall Cognitive Status: History of cognitive impairments - at baseline       Memory: Decreased short-term memory              General Comments      Exercises Other Exercises Other Exercises: Gait x140' with RW, cga--improved safety, stability and overall gait mechanics with use of RW; recommend continued use at this time.  Family aware and in agreement.      Assessment/Plan    PT Assessment Patient needs continued PT services  PT Diagnosis Difficulty walking;Generalized weakness   PT Problem List Decreased activity tolerance;Decreased balance;Decreased mobility;Decreased cognition;Decreased knowledge of use of DME;Decreased safety awareness;Decreased knowledge of precautions;Cardiopulmonary status limiting activity;Decreased coordination  PT Treatment Interventions DME instruction;Gait training;Stair training;Functional mobility training;Therapeutic activities;Therapeutic exercise;Balance training;Patient/family education   PT Goals (Current goals can be found in the Care Plan section) Acute Rehab PT Goals Patient Stated Goal: per family, to pursue STR as appropriate PT Goal Formulation: With family Time For Goal Achievement: 03/04/15 Potential to Achieve Goals: Fair    Frequency Min 2X/week   Barriers to discharge Decreased caregiver support unsafe for return home alone    Co-evaluation               End of Session Equipment Utilized During Treatment: Gait belt Activity Tolerance: Patient tolerated treatment well Patient left: in bed;with call bell/phone within reach;with bed alarm set;with family/visitor present Nurse Communication: Mobility status         Time: 1435-1454 PT Time Calculation (min) (ACUTE ONLY):  19 min   Charges:   PT Evaluation $PT Re-evaluation: 1 Procedure PT Treatments $Gait Training: 8-22 mins   PT G Codes:        [name] H. Manson Passey, PT, DPT, NCS 02/18/2015, 5:21 PM [phone]

## 2015-02-18 NOTE — Progress Notes (Signed)
[facility] [place] at [facility]   PATIENT NAME: Manuel Anthony    MR#:  [id number]  DATE OF BIRTH:  Oct 24, 1940  SUBJECTIVE:  CHIEF COMPLAINT:   Chief Complaint  Patient presents with  . Shortness of Breath  . Weakness     Admitted for SOB, weakness. Has baseline dementia. Cardiac cath 1/26 with DES in SVG to OM. Transferred to ICU for ionotrope support Later put on comfort measures Does have dementia Family at bedside.  Worked with PT earlier REVIEW OF SYSTEMS:    Review of Systems  Unable to perform ROS: dementia    DRUG ALLERGIES:   Allergies  Allergen Reactions  . Penicillins Other (See Comments)    "passes out"    VITALS:  Blood pressure 96/72, pulse 108, temperature 97.5 F (36.4 C), temperature source Oral, resp. rate 20, height  (1.676 m), weight 54.3 kg (119 lb 11.4 oz), SpO2 97 %.  PHYSICAL EXAMINATION:  GENERAL:  75 y.o.-year-old thin patient lying in the bed with no acute distress. HEENT: Head atraumatic, normocephalic. Oropharynx and nasopharynx clear. Overall mucosa appears dry. LUNGS: Normal breath sounds bilaterally, no wheezing. No use of accessory muscles of respiration. CARDIOVASCULAR: S1, S2 normal. No murmurs, rubs, or gallops.  ABDOMEN: Soft, nontender, nondistended. Bowel sounds present. No organomegaly or mass.  EXTREMITIES: No pedal edema, cyanosis, or clubbing.  PSYCHIATRIC: The patient is awake  Physical Exam LABORATORY PANEL:   CBC  Recent Labs Lab 02/14/15 0540  WBC 8.0  HGB 11.4*  HCT 36.0*  PLT 330   ------------------------------------------------------------------------------------------------------------------  Chemistries   Recent Labs Lab 02/13/15 0407  02/15/15 0423  NA 135  < > 137  K 3.8  < > 3.7  CL 100*  < > 100*  CO2 30  < > 28  GLUCOSE 151*  < > 100*  BUN 23*  < > 18  CREATININE 0.80  < > 0.96  CALCIUM 8.8*  < > 9.0  MG 1.8  --   --   < > = values in this  interval not displayed. ------------------------------------------------------------------------------------------------------------------  Cardiac Enzymes No results for input(s): TROPONINI in the last 168 hours. ------------------------------------------------------------------------------------------------------------------  RADIOLOGY:  No results found.  ASSESSMENT AND PLAN:   Active Problems:   CHF (congestive heart failure) (HCC)   Loss of weight   Abnormal CT scan, gastrointestinal tract   Protein-calorie malnutrition, severe   Acute systolic congestive heart failure (HCC)   Pleural effusion   Depression   Coronary artery disease involving coronary bypass graft of native heart with unstable angina pectoris (HCC)   SOB (shortness of breath)   Unstable angina (HCC)  # Acute on chronic systolic CHF  Echo- EF is 25%- he had CABG in [facility]. Overall poor prognosis. High risk for cardiac arrest and death. # CAD Drug-eluting stent placement to SVG to OM - 02/13/2015 # Fatigue, shortness of breath and weight loss concerning for malignancy, CT chest negative for PE but possible  nodules in the lungs.  # hypertension: held meds due to hypotension. # Diabetes mellitus type 2 # Hypokalemia # Acute encephalopathy over dementia Inpatient delirium  Discharge to SNF in AM  Management plans discussed with the patient, family and they are in agreement.  CODE STATUS: FULL  TOTAL TIME TAKING CARE OF THIS PATIENT: 25 minutes with >50% time spent in counseling and co-ordination of care   Orie Fisherman M.D on 02/18/2015   Between 7am to 6pm - Pager - [phone]  After 6pm go to [email or web address] - password [email or web address] [facility]  [place] [facility]  Office  [phone]  CC: Primary care physician; No PCP Per Patient  Note: This dictation was prepared with Dragon dictation along with smaller phrase technology. Any transcriptional errors that result from this process are  unintentional.

## 2015-02-19 ENCOUNTER — Encounter
Admission: RE | Admit: 2015-02-19 | Discharge: 2015-02-19 | Disposition: A | Discharge status: Home / Self Care | Payer: Medicare Other | Source: Ambulatory Visit | Attending: Internal Medicine | Admitting: Internal Medicine

## 2015-02-19 MED ORDER — ONDANSETRON HCL 4 MG PO TABS: 4.0000 mg | ORAL_TABLET | Freq: Four times a day (QID) | ORAL | Status: AC | PRN

## 2015-02-19 MED ORDER — LORAZEPAM 0.5 MG PO TABS: 0.5000 mg | ORAL_TABLET | Freq: Three times a day (TID) | ORAL | Status: AC

## 2015-02-19 MED ORDER — OXYCODONE HCL 5 MG PO TABS: 5.0000 mg | ORAL_TABLET | ORAL | Status: AC | PRN

## 2015-02-19 NOTE — Discharge Instructions (Signed)
°  DIET:  Regular diet  DISCHARGE CONDITION:  Stable  ACTIVITY:  Activity as tolerated  OXYGEN:  Home Oxygen: No.   Oxygen Delivery: room air  DISCHARGE LOCATION:  nursing home   If you experience worsening of your admission symptoms, develop shortness of breath, life threatening emergency, suicidal or homicidal thoughts you must seek medical attention immediately by calling 911 or calling your MD immediately  if symptoms less severe.  You Must read complete instructions/literature along with all the possible adverse reactions/side effects for all the Medicines you take and that have been prescribed to you. Take any new Medicines after you have completely understood and accpet all the possible adverse reactions/side effects.   Please note  You were cared for by a hospitalist during your hospital stay. If you have any questions about your discharge medications or the care you received while you were in the hospital after you are discharged, you can call the unit and asked to speak with the hospitalist on call if the hospitalist that took care of you is not available. Once you are discharged, your primary care physician will handle any further medical issues. Please note that NO REFILLS for any discharge medications will be authorized once you are discharged, as it is imperative that you return to your primary care physician (or establish a relationship with a primary care physician if you do not have one) for your aftercare needs so that they can reassess your need for medications and monitor your lab values.   Palliative care to follow at [facility]

## 2015-02-19 NOTE — Clinical Social Work Note (Signed)
Clinical Social Work Assessment  Patient Details  Name: Manuel Anthony MRN: [id number] Date of Birth: 04-Jan-1941  Date of referral:  02/19/15               Reason for consult:  Facility Placement                Permission sought to share information with:  Family Supports Permission granted to share information::  Yes, Verbal Permission Granted  Name::     Vegas Fritze, son and POA    Housing/Transportation Living arrangements for the past 2 months:  [place] of Information:  Patient, Adult Children Patient Interpreter Needed:  None Criminal Activity/Legal Involvement Pertinent to Current Situation/Hospitalization:  No - Comment as needed Significant Relationships:  None Lives with:   Alone Do you feel safe going back to the place where you live?  Yes Need for family participation in patient care:  Yes (Comment)  Care giving concerns:  Pt is in need of STR.   Social Worker assessment / plan:  CSW met with pt and family to address consult. CSW introduced herself and explained role of social work. CSW also explained the process of discharging to SNF. Pt and family understand that pt needs STR as recommended by PT. Pt's son prefers [facility]. CSW secured a bed at [facility]. Pt is ready for discharge today. Pt and family are agreeable to discharge plan. Facility is ready to accept pt as discharge information has been received.   RN will call report [phone] and room# 201B). [facility] will provide transportation.   CSW is signing off as no further needs identified.   Employment status:  Retired Forensic scientist:  Medicare PT Recommendations:  [place] / Referral to community resources:  [place]  Patient/Family's Response to care:  Pt and family were appreciative of CSW support.   Patient/Family's Understanding of and Emotional Response to Diagnosis, Current Treatment, and Prognosis:  Pt and  family understand that pt would need STR prior to returning to facility.   Emotional Assessment Appearance:  Appears stated age Attitude/Demeanor/Rapport:  Other (Appropriate) Affect (typically observed):  Accepting, Pleasant Orientation:  Oriented to Self, Oriented to Place, Oriented to  Time, Oriented to Situation Alcohol / Substance use:  Never Used Psych involvement (Current and /or in the community):  No (Comment)  Discharge Needs  Concerns to be addressed:  Adjustment to Illness Readmission within the last 30 days:  No Current discharge risk:  Chronically ill Barriers to Discharge:  No Barriers Identified   Darden Dates, LCSW 02/19/2015, 11:34 AM

## 2015-02-19 NOTE — Clinical Social Work Placement (Signed)
   CLINICAL SOCIAL WORK PLACEMENT  NOTE  Date:  02/19/2015  Patient Details  Name: Manuel Anthony MRN: [id number] Date of Birth: April 20, 1940  Clinical Social Work is seeking post-discharge placement for this patient at the Skilled  Nursing Facility level of care (*CSW will initial, date and re-position this form in  chart as items are completed):  Yes   Patient/family provided with [place] Clinical Social Work Department's list of facilities offering this level of care within the geographic area requested by the patient (or if unable, by the patient's family).  Yes   Patient/family informed of their freedom to choose among providers that offer the needed level of care, that participate in Medicare, Medicaid or managed care program needed by the patient, have an available bed and are willing to accept the patient.  Yes   Patient/family informed of [facility]'s ownership interest in [facility] and [facility], as well as of the fact that they are under no obligation to receive care at these facilities.  PASRR submitted to EDS on 02/18/15     PASRR number received on 02/18/15     Existing PASRR number confirmed on       FL2 transmitted to all facilities in geographic area requested by pt/family on 02/19/15     FL2 transmitted to all facilities within larger geographic area on       Patient informed that his/her managed care company has contracts with or will negotiate with certain facilities, including the following:        Yes   Patient/family informed of bed offers received.  Patient chooses bed at [facility]     Physician recommends and patient chooses bed at  [facility])    Patient to be transferred to [facility] on 02/19/15.  Patient to be transferred to facility by [facility]     Patient family notified on 02/19/15 of transfer.  Name of family member notified:  Evelena Peat     PHYSICIAN       Additional Comment:     _______________________________________________ Dede Query, LCSW 02/19/2015, 11:56 AM

## 2015-02-19 NOTE — Care Management Important Message (Signed)
Important Message  Patient Details  Name: Manuel Anthony MRN: [id number] Date of Birth: [birth year]   Medicare Important Message Given:  Yes    Olegario Messier A Daniesha Driver 02/19/2015, 9:05 AM

## 2015-02-19 NOTE — Progress Notes (Signed)
Palliative Medicine Inpatient Consult Follow Up Note   Name: Manuel Anthony Date: 02/19/2015 MRN: [id number]  DOB: [birth year]  Referring Physician: Milagros Loll, MD  Palliative Care consult requested for this 75 y.o. male for goals of medical therapy in patient with severe cardiomyopathy, CAD, and advancing (probably moderate at this stage) Alzheimers Dementia.  DISCUSSIONS AND DECISIONS I was asked to once again tell pt what was wrong with his heart. He takes statements from doctors in the past saying his heart was 'ok' as meaning everything is normal --and it is not.  He was given more details today by me in a way he seemed to understand. He will likely forget as his dementia is worsening.  He will benefit from PT (a good PHYSICAL rehab candidate) but he needs help with safety awareness and endurance (he gets tired just walking he says).Marland Kitchen    He is, however, to remain on just comfort meds and not heart or curative meds per family as they do not want him kept alive only so he can live long enough to become severely demented.  They want quality of life for now.  Pt understands overall, but he doesn't quite grasp the severity of his heart illness and does not seem to appreciate that he has dementia.    He will need a long term living arrangement when rehab is over since he will never again be safe living alone at home.   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 ---with EF 20-25% ---with associated dyspnea HTN DM2 CAD --h/o CABG (23 vessel) at [facility] --had cath 1/26 with stent --has unstable angina  Former Smoker Dyslipidemia Alzheimers Dementia on Aricept Wt Loss ---possibly partly due to grief (wife died July 16, 2022 of last year) Moderate Malnutrition ---Albumin is 3.0 Loss of appetite Depression Proctitis ---but cancer cannot be ruled out by imaging ---not stable enough from cardiac standpoint for  colonoscopy per GI consult Hypotension Metabolic Encephalopathy Hypokalemia Moderate Bilateral Pleural Effusions Chronic Pain with agitation when in pain --morphine helps per family right greater than left per CT Focal Fatty Liver infiltration Scarring / nodules in lungs Dry mouth     CODE STATUS: DNR   PAST MEDICAL HISTORY: Past Medical History  Diagnosis Date  . Diabetes mellitus without complication (HCC)   . CHF (congestive heart failure) (HCC)   . CAD (coronary artery disease)     a. CABG in 2003 following cath which showed 99% stenosis RCA, 99% LAD and 90% LCx  . Chronic systolic CHF (congestive heart failure) (HCC)     a. 02/11/2012: EF 20-25% by echo with diffuse hypokinesis  . HLD (hyperlipidemia)     PAST SURGICAL HISTORY:  Past Surgical History  Procedure Laterality Date  . Cardiac surgery    . Hernia repair    . Cardiac catheterization Right 02/13/2015    Procedure: Right and Left Heart Cath;  Surgeon: Antonieta Iba, MD;  Location: [facility] INVASIVE CV LAB;  Service: Cardiovascular;  Laterality: Right;  . Cardiac catheterization N/A 02/13/2015    Procedure: Coronary Stent Intervention;  Surgeon: Iran Ouch, MD;  Location: [facility] INVASIVE CV LAB;  Service: Cardiovascular;  Laterality: N/A;    Vital Signs: BP 92/59 mmHg  Pulse 106  Temp(Src) 97.6 F (36.4 C) (Oral)  Resp 18  Ht  (1.676 m)  Wt 54.3 kg (119 lb 11.4 oz)  BMI 19.33 kg/m2  SpO2 100% Filed Weights   02/14/15 0500 02/14/15  1045 02/15/15 0406  Weight: 54.114 kg (119 lb 4.8 oz) 54.3 kg (119 lb 11.4 oz) 54.3 kg (119 lb 11.4 oz)    Estimated body mass index is 19.33 kg/(m^2) as calculated from the following:   Height as of this encounter:  (1.676 m).   Weight as of this encounter: 54.3 kg (119 lb 11.4 oz).  PHYSICAL EXAM: NAD Alert  Gets facts a bit twisted around and has an odd way of talking about his heart.  He recalls 'some doctor' telling him that his heart is 'ok'.  Family  asked me to elaborate on his weak heart (low EF) and his CAD status.  He seems to understand --but he may forget. EOMI OP clear No JVD or TM Heart rrr distant Lungs cta Abd soft and NT Skin warm and dry   LABS: CBC:    Component Value Date/Time   WBC 8.0 02/14/2015 0540   HGB 11.4* 02/14/2015 0540   HCT 36.0* 02/14/2015 0540   PLT 330 02/14/2015 0540   MCV 79.1* 02/14/2015 0540   NEUTROABS 4.6 02/10/2015 0838   LYMPHSABS 1.6 02/10/2015 0838   MONOABS 0.3 02/10/2015 0838   EOSABS 0.0 02/10/2015 0838   BASOSABS 0.1 02/10/2015 0838   Comprehensive Metabolic Panel:    Component Value Date/Time   NA 137 02/15/2015 0423   K 3.7 02/15/2015 0423   CL 100* 02/15/2015 0423   CO2 28 02/15/2015 0423   BUN 18 02/15/2015 0423   CREATININE 0.96 02/15/2015 0423   GLUCOSE 100* 02/15/2015 0423   CALCIUM 9.0 02/15/2015 0423   AST 19 02/10/2015 0838   ALT 14* 02/10/2015 0838   ALKPHOS 69 02/10/2015 0838   BILITOT 1.3* 02/10/2015 0838   PROT 7.6 02/10/2015 0838   ALBUMIN 3.0* 02/17/2015 1402     REFERRALS TO BE ORDERED:  PALLIATIVE CARE CONSULT IN THE FACILITY ([facility])   More than 50% of the visit was spent in counseling/coordination of care: YES  Time Spent:   35 min

## 2015-02-19 NOTE — Discharge Summary (Signed)
[facility] - [place] at [facility]   PATIENT NAME: Manuel Anthony    MR#:  [id number]  DATE OF BIRTH:  May 10, 1940  DATE OF ADMISSION:  02/10/2015 ADMITTING PHYSICIAN: Katha Hamming, MD  DATE OF DISCHARGE: No discharge date for patient encounter.  PRIMARY CARE PHYSICIAN: No PCP Per Patient    ADMISSION DIAGNOSIS:  Pleural effusion [J90] Acute systolic congestive heart failure (HCC) [I50.21]  DISCHARGE DIAGNOSIS:  Active Problems:   CHF (congestive heart failure) (HCC)   Loss of weight   Abnormal CT scan, gastrointestinal tract   Protein-calorie malnutrition, severe   Acute systolic congestive heart failure (HCC)   Pleural effusion   Depression   Coronary artery disease involving coronary bypass graft of native heart with unstable angina pectoris (HCC)   SOB (shortness of breath)   Unstable angina (HCC)   SECONDARY DIAGNOSIS:   Past Medical History  Diagnosis Date  . Diabetes mellitus without complication (HCC)   . CHF (congestive heart failure) (HCC)   . CAD (coronary artery disease)     a. CABG in 2003 following cath which showed 99% stenosis RCA, 99% LAD and 90% LCx  . Chronic systolic CHF (congestive heart failure) (HCC)     a. 02/11/2012: EF 20-25% by echo with diffuse hypokinesis  . HLD (hyperlipidemia)      ADMITTING HISTORY  Duvall Comes is a 75 y.o. male with a known history of htn,DMII,came in with SOB. Patient is having shortness of breath for the past 1 week associated with orthopnea, PND. No pedal edema. Had chest discomfort earlier today. Denies any at this time. Has some cough but no phlegm. Family also reported that he is been losing weight and also has some fatigue. No night sweats. No recent travel.   HOSPITAL COURSE:   # Acute on chronic systolic CHF Echo- EF is 25%- he had CABG in [facility]. Overall poor prognosis. High risk for cardiac arrest and death. # CAD - NSTEMI Drug-eluting stent placement to SVG to OM  - 02/13/2015 # Fatigue, shortness of breath and weight loss concerning for malignancy, CT chest negative for PE but possible nodules in the lungs.  # hypertension: held meds due to hypotension. # Diabetes mellitus type 2 # Hypokalemia # Acute encephalopathy over dementia Inpatient delirium   Due to worsening condition family requested patient be made DNR. They do not want any aggressive therapy at this time. Have requested rehab .  Ordered morphine and ativan PRN.  D/C to SNF with palliative following.  Transition to hospice home if significant worsening and family aware of this.  CONSULTS OBTAINED:     DRUG ALLERGIES:   Allergies  Allergen Reactions  . Penicillins Other (See Comments)    "passes out"    DISCHARGE MEDICATIONS:   Current Discharge Medication List    START taking these medications   Details  LORazepam (ATIVAN) 0.5 MG tablet Take 1 tablet (0.5 mg total) by mouth every 8 (eight) hours. Qty: 30 tablet, Refills: 0    ondansetron (ZOFRAN) 4 MG tablet Take 1 tablet (4 mg total) by mouth every 6 (six) hours as needed for nausea or vomiting. Qty: 20 tablet, Refills: 0    oxyCODONE (ROXICODONE) 5 MG immediate release tablet Take 1 tablet (5 mg total) by mouth every 4 (four) hours as needed for severe pain. Qty: 30 tablet, Refills: 0      CONTINUE these medications which have NOT CHANGED   Details  donepezil (ARICEPT) 10 MG tablet Take 10  mg by mouth at bedtime.      STOP taking these medications     aspirin EC 81 MG tablet      Cholecalciferol 1000 units tablet      glipiZIDE (GLUCOTROL) 10 MG tablet      metFORMIN (GLUCOPHAGE) 1000 MG tablet      pravastatin (PRAVACHOL) 80 MG tablet          Today    VITAL SIGNS:  Blood pressure 92/59, pulse 106, temperature 97.6 F (36.4 C), temperature source Oral, resp. rate 18, height  (1.676 m), weight 54.3 kg (119 lb 11.4 oz), SpO2 100 %.  I/O:   Intake/Output Summary (Last 24 hours) at  02/19/15 0839 Last data filed at 02/18/15 1610  Gross per 24 hour  Intake    480 ml  Output   1000 ml  Net   -520 ml    PHYSICAL EXAMINATION:  Physical Exam  GENERAL:  75 y.o.-year-old patient lying in the bed with no acute distress.  LUNGS: Normal breath sounds bilaterally. CARDIOVASCULAR: S1, S2  ABDOMEN: Soft, non-tender NEUROLOGIC: Moves all 4 extremities. PSYCHIATRIC: The patient is alert and awake  DATA REVIEW:   CBC  Recent Labs Lab 02/14/15 0540  WBC 8.0  HGB 11.4*  HCT 36.0*  PLT 330    Chemistries   Recent Labs Lab 02/13/15 0407  02/15/15 0423  NA 135  < > 137  K 3.8  < > 3.7  CL 100*  < > 100*  CO2 30  < > 28  GLUCOSE 151*  < > 100*  BUN 23*  < > 18  CREATININE 0.80  < > 0.96  CALCIUM 8.8*  < > 9.0  MG 1.8  --   --   < > = values in this interval not displayed.  Cardiac Enzymes No results for input(s): TROPONINI in the last 168 hours.  Microbiology Results  Results for orders placed or performed during the hospital encounter of 02/10/15  MRSA PCR Screening     Status: None   Collection Time: 02/14/15 10:53 AM  Result Value Ref Range Status   MRSA by PCR NEGATIVE NEGATIVE Final    Comment:        The GeneXpert MRSA Assay (FDA approved for NASAL specimens only), is one component of a comprehensive MRSA colonization surveillance program. It is not intended to diagnose MRSA infection nor to guide or monitor treatment for MRSA infections.     RADIOLOGY:  No results found.    Follow up with PCP in 1 week.  Management plans discussed with the patient, family and they are in agreement.  CODE STATUS:     Code Status Orders        Start     Ordered   02/15/15 2349  DNR (Do not attempt resuscitation)   Continuous    Question Answer Comment  In the event of cardiac or respiratory ARREST Do not call a "code blue"   In the event of cardiac or respiratory ARREST Do not perform Intubation, CPR, defibrillation or ACLS   In the event  of cardiac or respiratory ARREST Use medication by any route, position, wound care, and other measures to relive pain and suffering. May use oxygen, suction and manual treatment of airway obstruction as needed for comfort.      02/15/15 2348    Code Status History    Date Active Date Inactive Code Status Order ID Comments User Context   02/14/2015  4:09  PM 02/15/2015 11:48 PM DNR [id number]  Erin Fulling, MD Inpatient   02/13/2015  2:09 PM 02/14/2015  4:09 PM Full Code [id number]  Iran Ouch, MD Inpatient   02/10/2015 12:32 PM 02/13/2015  2:09 PM Full Code [id number]  Katha Hamming, MD ED    Advance Directive Documentation        Most Recent Value   Type of Advance Directive  Healthcare Power of Attorney   Pre-existing out of facility DNR order (yellow form or pink MOST form)     "MOST" Form in Place?        TOTAL TIME TAKING CARE OF THIS PATIENT ON DAY OF DISCHARGE: more than 30 minutes.    Milagros Loll R M.D on 02/19/2015 at 8:39 AM  Between 7am to 6pm - Pager - [phone]  After 6pm go to [email or web address] - password [email or web address] [facility]  [place] [facility]  Office  [phone]  CC: Primary care physician; No PCP Per Patient     Note: This dictation was prepared with Dragon dictation along with smaller phrase technology. Any transcriptional errors that result from this process are unintentional.

## 2015-02-19 NOTE — Progress Notes (Signed)
Patient discharged to [facility]. Report given to [place] at facility. EMS called for transport.

## 2015-02-20 NOTE — Care Management (Signed)
Discharge summary faxed to [facility] Gwenette Greet RN MSN CCM Care Management [phone]

## 2015-02-22 LAB — GLUCOSE, CAPILLARY
GLUCOSE-CAPILLARY: 226 mg/dL — AB (ref 65–99)
Glucose-Capillary: 250 mg/dL — ABNORMAL HIGH (ref 65–99)

## 2015-02-23 LAB — GLUCOSE, CAPILLARY
GLUCOSE-CAPILLARY: 211 mg/dL — AB (ref 65–99)
GLUCOSE-CAPILLARY: 199 mg/dL — AB (ref 65–99)
Glucose-Capillary: 206 mg/dL — ABNORMAL HIGH (ref 65–99)
Glucose-Capillary: 160 mg/dL — ABNORMAL HIGH (ref 65–99)

## 2015-02-24 LAB — GLUCOSE, CAPILLARY
GLUCOSE-CAPILLARY: 249 mg/dL — AB (ref 65–99)
GLUCOSE-CAPILLARY: 277 mg/dL — AB (ref 65–99)
GLUCOSE-CAPILLARY: 182 mg/dL — AB (ref 65–99)
Glucose-Capillary: 185 mg/dL — ABNORMAL HIGH (ref 65–99)

## 2015-02-25 LAB — GLUCOSE, CAPILLARY
GLUCOSE-CAPILLARY: 253 mg/dL — AB (ref 65–99)
GLUCOSE-CAPILLARY: 157 mg/dL — AB (ref 65–99)
Glucose-Capillary: 152 mg/dL — ABNORMAL HIGH (ref 65–99)

## 2015-02-26 LAB — GLUCOSE, CAPILLARY
GLUCOSE-CAPILLARY: 168 mg/dL — AB (ref 65–99)
Glucose-Capillary: 178 mg/dL — ABNORMAL HIGH (ref 65–99)
Glucose-Capillary: 150 mg/dL — ABNORMAL HIGH (ref 65–99)
Glucose-Capillary: 197 mg/dL — ABNORMAL HIGH (ref 65–99)
Glucose-Capillary: 179 mg/dL — ABNORMAL HIGH (ref 65–99)

## 2015-02-27 LAB — GLUCOSE, CAPILLARY
GLUCOSE-CAPILLARY: 260 mg/dL — AB (ref 65–99)
Glucose-Capillary: 143 mg/dL — ABNORMAL HIGH (ref 65–99)
Glucose-Capillary: 255 mg/dL — ABNORMAL HIGH (ref 65–99)
Glucose-Capillary: 174 mg/dL — ABNORMAL HIGH (ref 65–99)

## 2015-02-27 LAB — URINALYSIS COMPLETE WITH MICROSCOPIC (ARMC ONLY)
BILIRUBIN URINE: NEGATIVE
Glucose, UA: NEGATIVE mg/dL
Ketones, ur: NEGATIVE mg/dL
NITRITE: POSITIVE — AB
PH: 5 (ref 5.0–8.0)
Protein, ur: 30 mg/dL — AB
SPECIFIC GRAVITY, URINE: 1.02 (ref 1.005–1.030)
Squamous Epithelial / LPF: NONE SEEN

## 2015-02-28 ENCOUNTER — Ambulatory Visit: Payer: Non-veteran care | Admitting: Family

## 2015-03-02 LAB — URINE CULTURE

## 2015-03-02 LAB — GLUCOSE, CAPILLARY
GLUCOSE-CAPILLARY: 131 mg/dL — AB (ref 65–99)
GLUCOSE-CAPILLARY: 222 mg/dL — AB (ref 65–99)
GLUCOSE-CAPILLARY: 227 mg/dL — AB (ref 65–99)
GLUCOSE-CAPILLARY: 164 mg/dL — AB (ref 65–99)
Glucose-Capillary: 182 mg/dL — ABNORMAL HIGH (ref 65–99)
Glucose-Capillary: 123 mg/dL — ABNORMAL HIGH (ref 65–99)
Glucose-Capillary: 166 mg/dL — ABNORMAL HIGH (ref 65–99)
Glucose-Capillary: 202 mg/dL — ABNORMAL HIGH (ref 65–99)
Glucose-Capillary: 187 mg/dL — ABNORMAL HIGH (ref 65–99)

## 2015-03-19 DEATH — deceased

## 2017-07-19 IMAGING — CT CT ABD-PELV W/ CM
2 of 5 series · 15 of 31 positions shown, 18 images · IV contrast (APPLIED)
Comparison: Chest x-ray earlier today.

CLINICAL DATA: Worsening shortness of breath over the past few
days. Weakness.

EXAM:
CT ANGIOGRAPHY CHEST
CT ABDOMEN AND PELVIS WITH CONTRAST
TECHNIQUE: Multidetector CT imaging of the chest was performed using the
standard protocol during bolus administration of intravenous
contrast. Multiplanar CT image reconstructions and MIPs were
obtained to evaluate the vascular anatomy. Multidetector CT imaging
of the abdomen and pelvis was performed using the standard protocol
during bolus administration of intravenous contrast.
CONTRAST:  100mL OMNIPAQUE IOHEXOL 350 MG/ML SOLN

[Series 4: routine abd pel with · axial · 0.75mm/px · z∈[-1106,-896]mm · 3 of 94 slices shown]
[im 24/94  lung]
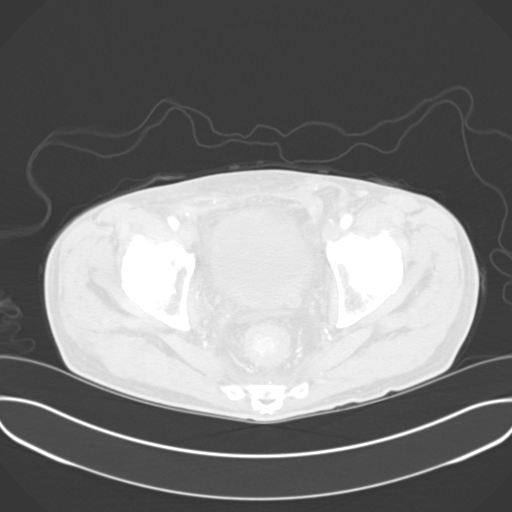
[im 47/94  lung]
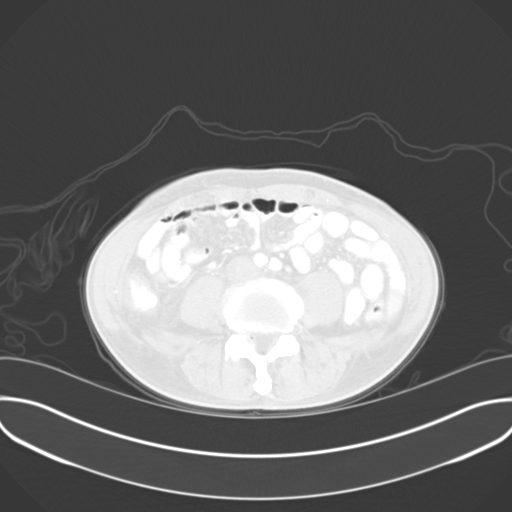
[im 66/94  lung]
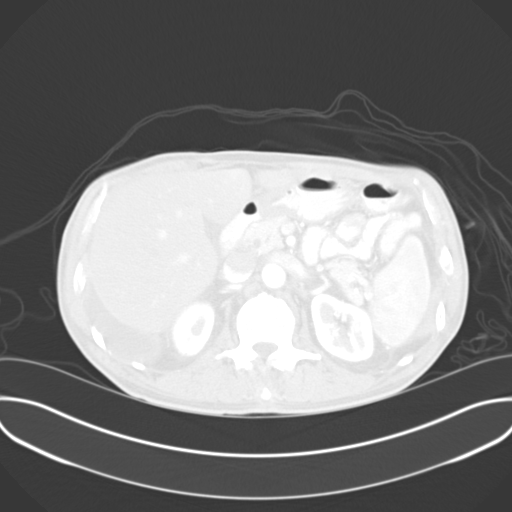

[Series 7: pe 1.0 thins · axial · 0.75mm/px · z∈[-871,-581]mm · 12 of 344 slices shown, 15 images]
[im 27/344  mediastinal]
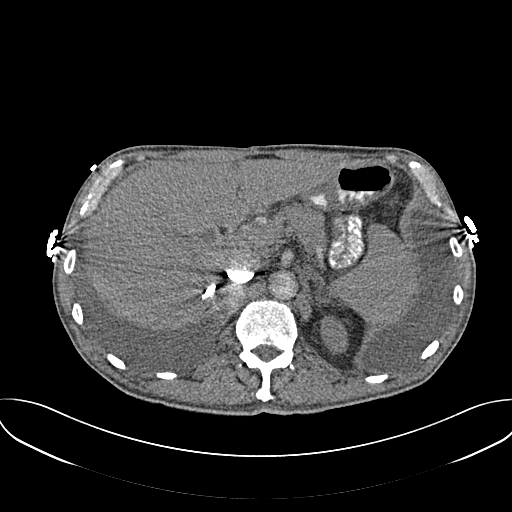
[im 27/344  lung]
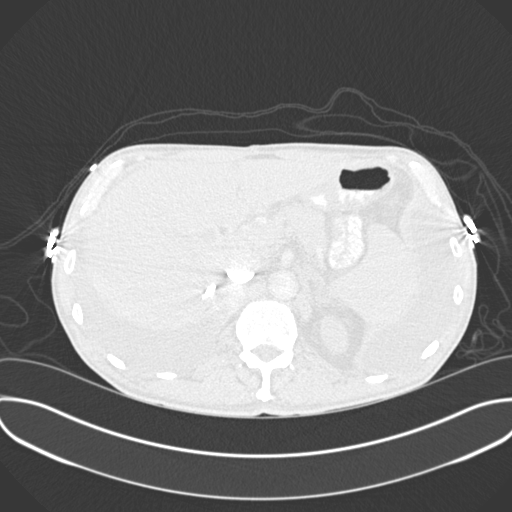
[im 53/344  lung]
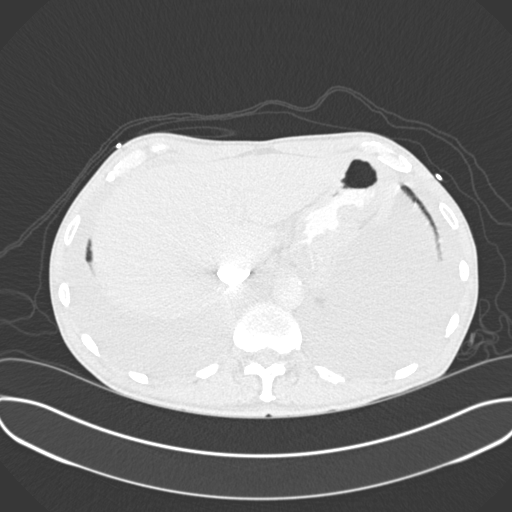
[im 80/344  lung]
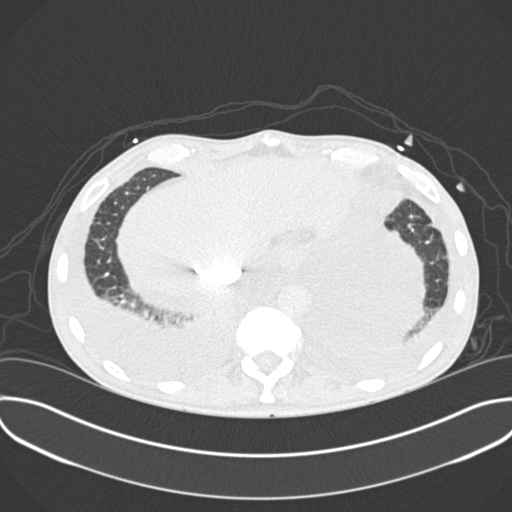
[im 106/344  lung]
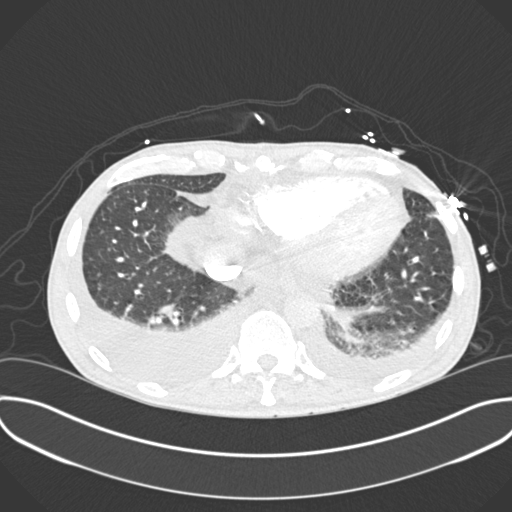
[im 132/344  mediastinal]
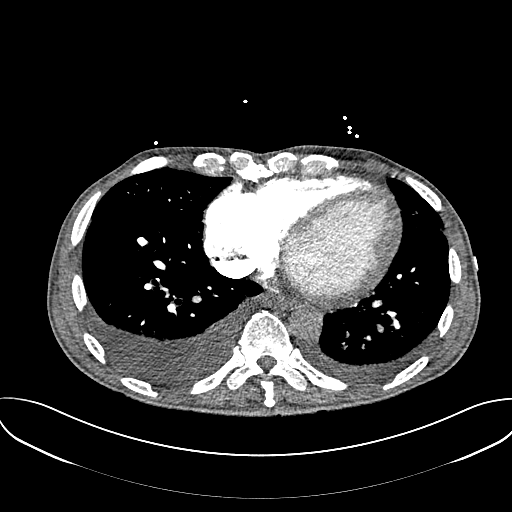
[im 132/344  lung]
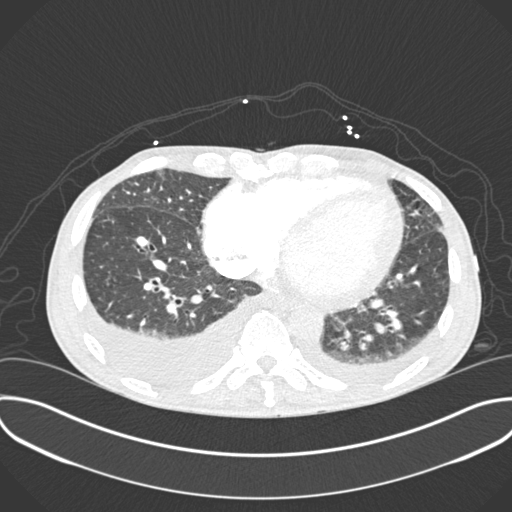
[im 159/344  lung]
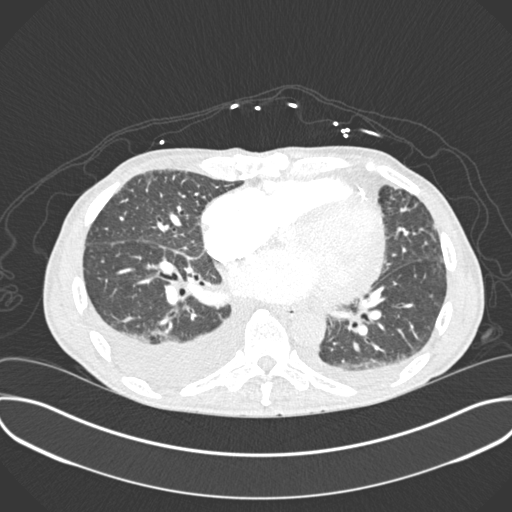
[im 185/344  lung]
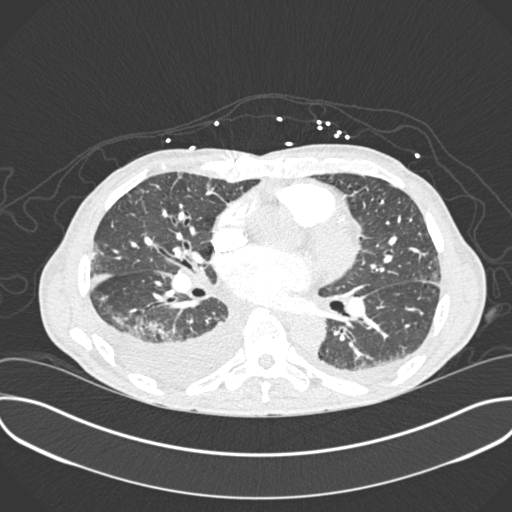
[im 212/344  lung]
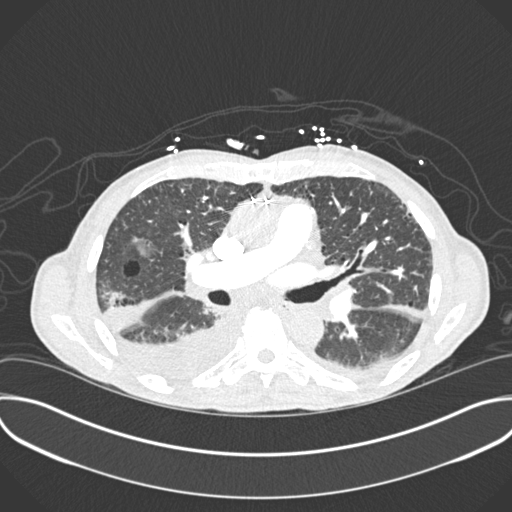
[im 238/344  mediastinal]
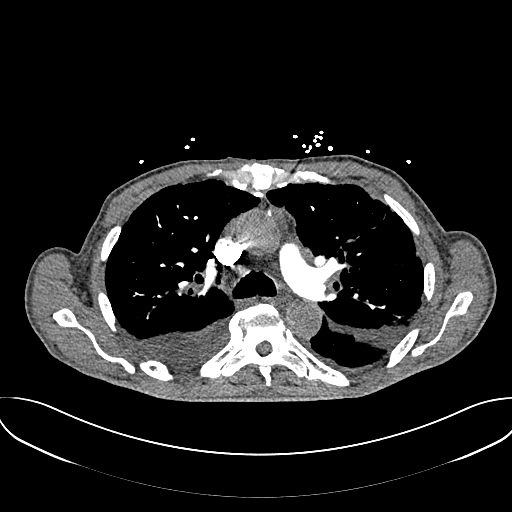
[im 238/344  lung]
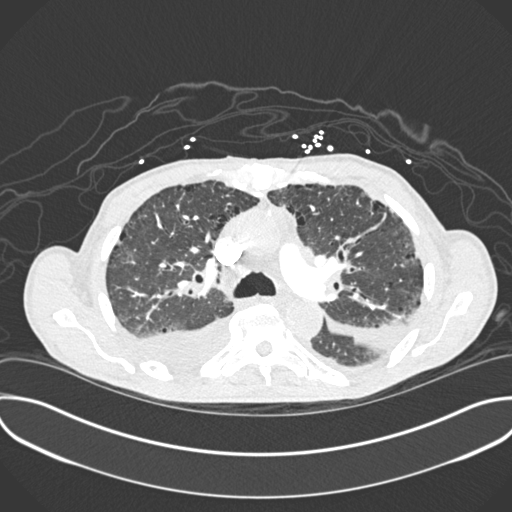
[im 264/344  lung]
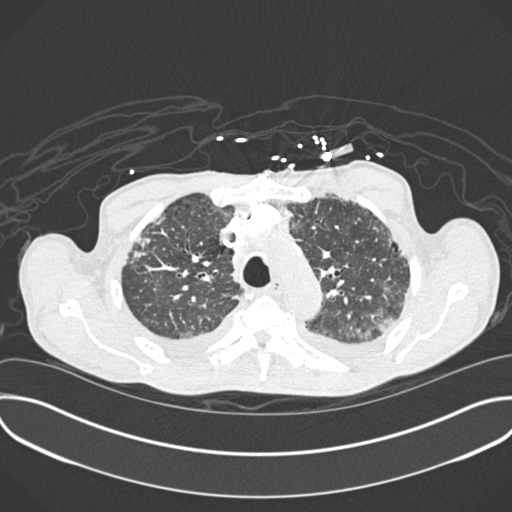
[im 291/344  lung]
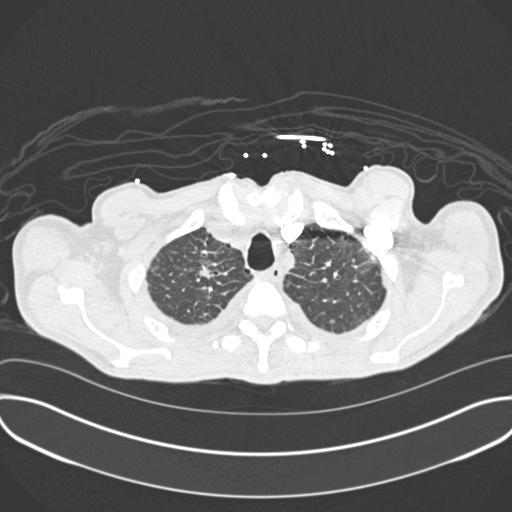
[im 317/344  lung]
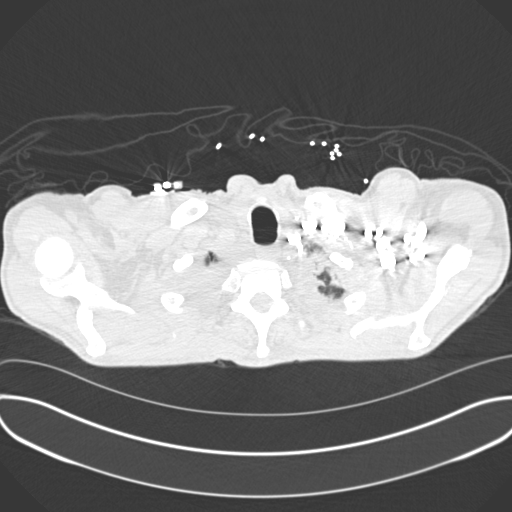

[15 of 31 positions shown; findings below may reference images not displayed]

FINDINGS: CTA CHEST FINDINGS

No filling defects in the pulmonary arteries to suggest pulmonary
emboli. There is mild cardiomegaly. Coronary artery calcifications.
Prior CABG. Aorta is normal caliber. No mediastinal, hilar, or
axillary adenopathy.

Moderate bilateral pleural effusions, right greater than left. There
is a subpulmonic component on the left. Minimal compressive
atelectasis in the lower lobes bilaterally. Peripheral densities are
noted in the upper lobes bilaterally most compatible with scarring.
Several scattered pulmonary nodules in the upper lobes bilaterally
as well. 8 mm nodule in the left upper lobe on image 25. 9 mm nodule
in the upper lobe on image 20. 8 mm nodule in the right upper lobe
on image 27 with smaller subpleural nodule in on image 23. Mild
peribronchial thickening.

Chest wall soft tissues are unremarkable. No acute bony abnormality.

CT ABDOMEN and PELVIS FINDINGS

Low-density area adjacent to the falciform ligament within the liver
likely represents area of focal fatty infiltration. No suspicious
hepatic abnormality. Spleen, pancreas, adrenals and right kidney are
unremarkable. Left lower pole parapelvic cyst which appears benign.
No hydronephrosis.

Aortic atherosclerotic calcifications and irregularity. No aneurysm.
Stomach, small bowel decompressed and unremarkable. Circumferential
wall thickening within the rectum suggesting proctitis. Remainder of
the colon wall is normal. There is a small left femoral hernia noted
containing the anterior wall of the sigmoid colon. No obstruction.

Urinary bladder is unremarkable. There is trace free fluid in the
pelvis. No free air or adenopathy.

No acute bony abnormality or focal bone lesion.

Review of the MIP images confirms the above findings.
IMPRESSION: No evidence of pulmonary embolus.

Cardiomegaly.  Moderate bilateral pleural effusions.

Peripheral densities in the upper lobes bilaterally most compatible
with scarring. Bilateral upper lobe nodular densities warrant
follow-up. Recommend follow-up CT in 3-6 months to assess stability.

Area of circumferential wall thickening within the click rectum most
compatible with infectious or inflammatory proctitis. Cancer felt
less likely but cannot be completely excluded. Consider colonoscopy
after acute symptoms resolve.

Small left femoral hernia containing the anterior wall of the
sigmoid colon. No evidence of bowel obstruction.

Small amount of free fluid in the pelvis.
